# Patient Record
Sex: Male | Born: 1965 | Race: White | Hispanic: No | Marital: Married | Smoking: Never smoker
Health system: Southern US, Community
[De-identification: ages and names within clinical notes are randomized; demographics above are authoritative.]

## PROBLEM LIST (undated history)

## (undated) DIAGNOSIS — N210 Calculus in bladder: Secondary | ICD-10-CM

## (undated) DIAGNOSIS — I10 Essential (primary) hypertension: Secondary | ICD-10-CM

## (undated) DIAGNOSIS — R001 Bradycardia, unspecified: Secondary | ICD-10-CM

## (undated) DIAGNOSIS — R7303 Prediabetes: Secondary | ICD-10-CM

## (undated) DIAGNOSIS — E7800 Pure hypercholesterolemia, unspecified: Secondary | ICD-10-CM

## (undated) HISTORY — PX: COLONOSCOPY: SHX174

---

## 1998-09-05 ENCOUNTER — Ambulatory Visit (HOSPITAL_COMMUNITY): Admission: RE | Admit: 1998-09-05 | Discharge: 1998-09-05 | Payer: Self-pay | Admitting: Orthopedic Surgery

## 1998-09-05 ENCOUNTER — Encounter: Payer: Self-pay | Admitting: Orthopedic Surgery

## 1999-01-13 ENCOUNTER — Emergency Department (HOSPITAL_COMMUNITY): Admission: EM | Admit: 1999-01-13 | Discharge: 1999-01-13 | Payer: Self-pay | Admitting: Emergency Medicine

## 2004-02-09 ENCOUNTER — Emergency Department (HOSPITAL_COMMUNITY): Admission: EM | Admit: 2004-02-09 | Discharge: 2004-02-09 | Payer: Self-pay | Admitting: Emergency Medicine

## 2010-07-28 ENCOUNTER — Emergency Department (HOSPITAL_COMMUNITY): Admission: EM | Admit: 2010-07-28 | Discharge: 2010-07-28 | Payer: Self-pay | Admitting: Emergency Medicine

## 2018-12-05 ENCOUNTER — Ambulatory Visit: Payer: 59 | Admitting: Urology

## 2018-12-05 DIAGNOSIS — R310 Gross hematuria: Secondary | ICD-10-CM | POA: Diagnosis not present

## 2018-12-12 ENCOUNTER — Other Ambulatory Visit (HOSPITAL_COMMUNITY): Payer: Self-pay | Admitting: Urology

## 2018-12-12 ENCOUNTER — Other Ambulatory Visit: Payer: Self-pay | Admitting: Urology

## 2018-12-12 DIAGNOSIS — R310 Gross hematuria: Secondary | ICD-10-CM

## 2019-01-02 ENCOUNTER — Ambulatory Visit (HOSPITAL_COMMUNITY)
Admission: RE | Admit: 2019-01-02 | Discharge: 2019-01-02 | Disposition: A | Discharge status: Home / Self Care | Payer: 59 | Source: Ambulatory Visit | Attending: Urology | Admitting: Urology

## 2019-01-02 ENCOUNTER — Other Ambulatory Visit: Payer: Self-pay

## 2019-01-02 DIAGNOSIS — R310 Gross hematuria: Secondary | ICD-10-CM | POA: Diagnosis not present

## 2019-01-02 LAB — POCT I-STAT CREATININE: Creatinine, Ser: 1.3 mg/dL — ABNORMAL HIGH (ref 0.61–1.24)

## 2019-01-02 MED ORDER — IOHEXOL 300 MG/ML  SOLN
100.0000 mL | Freq: Once | INTRAMUSCULAR | Status: AC | PRN
  Administered 2019-01-02: 100 mL via INTRAVENOUS

## 2019-03-06 ENCOUNTER — Ambulatory Visit (INDEPENDENT_AMBULATORY_CARE_PROVIDER_SITE_OTHER): Payer: 59 | Admitting: Urology

## 2019-03-06 DIAGNOSIS — N210 Calculus in bladder: Secondary | ICD-10-CM | POA: Diagnosis not present

## 2019-03-06 DIAGNOSIS — R3912 Poor urinary stream: Secondary | ICD-10-CM

## 2019-03-06 DIAGNOSIS — N401 Enlarged prostate with lower urinary tract symptoms: Secondary | ICD-10-CM | POA: Diagnosis not present

## 2019-03-06 DIAGNOSIS — R310 Gross hematuria: Secondary | ICD-10-CM

## 2019-03-10 ENCOUNTER — Other Ambulatory Visit: Payer: Self-pay | Admitting: Urology

## 2019-04-09 NOTE — Patient Instructions (Addendum)
YOU NEED TO HAVE A COVID 19 TEST ON_______Monday , July 6th_______, THIS TEST MUST BE DONE BEFORE SURGERY, COME TO [facility] ENTRANCE. ONCE YOUR COVID TEST IS COMPLETED, PLEASE BEGIN THE QUARANTINE INSTRUCTIONS AS OUTLINED IN YOUR HANDOUT.                Nathan Carroll   Your procedure is scheduled on: 04-16-2019   Report to [facility] [place] Main  Entrance    Report to [place] at 5:30 AM      Call this number if you have problems the morning of surgery [phone]    Remember: Do not eat food or drink liquids :After Midnight. BRUSH YOUR TEETH MORNING OF SURGERY AND RINSE YOUR MOUTH OUT, NO CHEWING GUM CANDY OR MINTS.     Take these medicines the morning of surgery with A SIP OF WATER: rosuvastatin  DO NOT TAKE ANY DIABETIC MEDICATIONS DAY OF YOUR SURGERY           How to Manage Your Diabetes Before and After Surgery  Why is it important to control my blood sugar before and after surgery? . Improving blood sugar levels before and after surgery helps healing and can limit problems. . A way of improving blood sugar control is eating a healthy diet by: o  Eating less sugar and carbohydrates o  Increasing activity/exercise o  Talking with your doctor about reaching your blood sugar goals . High blood sugars (greater than 180 mg/dL) can raise your risk of infections and slow your recovery, so you will need to focus on controlling your diabetes during the weeks before surgery. . Make sure that the doctor who takes care of your diabetes knows about your planned surgery including the date and location.    Reviewed and Endorsed by [facility] Patient Education Committee, August 2015                       You may not have any metal on your body including hair pins and              piercings  Do not wear jewelry, make-up, lotions, powders or perfumes, deodorant             Do not wear nail polish.  Do not shave  48 hours prior to surgery.               Men may shave face and neck.   Do not bring valuables to the hospital. [place].  Contacts, dentures or bridgework may not be worn into surgery.  Leave suitcase in the car. After surgery it may be brought to your room.      _____________________________________________________________________             [facility] - Preparing for Surgery Before surgery, you can play an important role.  Because skin is not sterile, your skin needs to be as free of germs as possible.  You can reduce the number of germs on your skin by washing with CHG (chlorahexidine gluconate) soap before surgery.  CHG is an antiseptic cleaner which kills germs and bonds with the skin to continue killing germs even after washing. Please DO NOT use if you have an allergy to CHG or antibacterial soaps.  If your skin becomes reddened/irritated stop using the CHG and inform your nurse when  you arrive at Short Stay. Do not shave (including legs and underarms) for at least 48 hours prior to the first CHG shower.  You may shave your face/neck. Please follow these instructions carefully:  1.  Shower with CHG Soap the night before surgery and the  morning of Surgery.  2.  If you choose to wash your hair, wash your hair first as usual with your  normal  shampoo.  3.  After you shampoo, rinse your hair and body thoroughly to remove the  shampoo.                           4.  Use CHG as you would any other liquid soap.  You can apply chg directly  to the skin and wash                       Gently with a scrungie or clean washcloth.  5.  Apply the CHG Soap to your body ONLY FROM THE NECK DOWN.   Do not use on face/ open                           Wound or open sores. Avoid contact with eyes, ears mouth and genitals (private parts).                       Wash face,  Genitals (private parts) with your normal soap.             6.  Wash thoroughly, paying special attention to the area where  your surgery  will be performed.  7.  Thoroughly rinse your body with warm water from the neck down.  8.  DO NOT shower/wash with your normal soap after using and rinsing off  the CHG Soap.                9.  Pat yourself dry with a clean towel.            10.  Wear clean pajamas.            11.  Place clean sheets on your bed the night of your first shower and do not  sleep with pets. Day of Surgery : Do not apply any lotions/deodorants the morning of surgery.  Please wear clean clothes to the hospital/surgery center.  FAILURE TO FOLLOW THESE INSTRUCTIONS MAY RESULT IN THE CANCELLATION OF YOUR SURGERY PATIENT SIGNATURE_________________________________  NURSE SIGNATURE__________________________________  ________________________________________________________________________

## 2019-04-13 ENCOUNTER — Other Ambulatory Visit: Payer: Self-pay

## 2019-04-13 ENCOUNTER — Other Ambulatory Visit (HOSPITAL_COMMUNITY)
Admission: RE | Admit: 2019-04-13 | Discharge: 2019-04-13 | Disposition: A | Discharge status: Home / Self Care | Payer: 59 | Source: Ambulatory Visit | Attending: Urology | Admitting: Urology

## 2019-04-13 ENCOUNTER — Encounter (HOSPITAL_COMMUNITY): Payer: Self-pay

## 2019-04-13 ENCOUNTER — Encounter (HOSPITAL_COMMUNITY)
Admission: RE | Admit: 2019-04-13 | Discharge: 2019-04-13 | Disposition: A | Discharge status: Home / Self Care | Payer: 59 | Source: Ambulatory Visit | Attending: Urology | Admitting: Urology

## 2019-04-13 DIAGNOSIS — Z833 Family history of diabetes mellitus: Secondary | ICD-10-CM | POA: Diagnosis not present

## 2019-04-13 DIAGNOSIS — R3912 Poor urinary stream: Secondary | ICD-10-CM | POA: Diagnosis not present

## 2019-04-13 DIAGNOSIS — N320 Bladder-neck obstruction: Secondary | ICD-10-CM | POA: Diagnosis not present

## 2019-04-13 DIAGNOSIS — N210 Calculus in bladder: Secondary | ICD-10-CM | POA: Diagnosis present

## 2019-04-13 DIAGNOSIS — Z1159 Encounter for screening for other viral diseases: Secondary | ICD-10-CM | POA: Diagnosis not present

## 2019-04-13 DIAGNOSIS — Z8249 Family history of ischemic heart disease and other diseases of the circulatory system: Secondary | ICD-10-CM | POA: Diagnosis not present

## 2019-04-13 DIAGNOSIS — Z7984 Long term (current) use of oral hypoglycemic drugs: Secondary | ICD-10-CM | POA: Diagnosis not present

## 2019-04-13 DIAGNOSIS — I10 Essential (primary) hypertension: Secondary | ICD-10-CM | POA: Diagnosis not present

## 2019-04-13 DIAGNOSIS — R739 Hyperglycemia, unspecified: Secondary | ICD-10-CM | POA: Diagnosis not present

## 2019-04-13 DIAGNOSIS — Z79899 Other long term (current) drug therapy: Secondary | ICD-10-CM | POA: Diagnosis not present

## 2019-04-13 DIAGNOSIS — N401 Enlarged prostate with lower urinary tract symptoms: Secondary | ICD-10-CM | POA: Diagnosis not present

## 2019-04-13 DIAGNOSIS — E7800 Pure hypercholesterolemia, unspecified: Secondary | ICD-10-CM | POA: Diagnosis not present

## 2019-04-13 HISTORY — DX: Calculus in bladder: N21.0

## 2019-04-13 HISTORY — DX: Essential (primary) hypertension: I10

## 2019-04-13 HISTORY — DX: Bradycardia, unspecified: R00.1

## 2019-04-13 HISTORY — DX: Pure hypercholesterolemia, unspecified: E78.00

## 2019-04-13 HISTORY — DX: Prediabetes: R73.03

## 2019-04-13 LAB — CBC
HCT: 46.3 % (ref 39.0–52.0)
Hemoglobin: 15.6 g/dL (ref 13.0–17.0)
MCH: 26.9 pg (ref 26.0–34.0)
MCHC: 33.7 g/dL (ref 30.0–36.0)
MCV: 80 fL (ref 80.0–100.0)
Platelets: 178 10*3/uL (ref 150–400)
RBC: 5.79 MIL/uL (ref 4.22–5.81)
RDW: 13.6 % (ref 11.5–15.5)
WBC: 8.1 10*3/uL (ref 4.0–10.5)
nRBC: 0 % (ref 0.0–0.2)

## 2019-04-13 LAB — BASIC METABOLIC PANEL
Anion gap: 8 (ref 5–15)
BUN: 30 mg/dL — ABNORMAL HIGH (ref 6–20)
CO2: 22 mmol/L (ref 22–32)
Calcium: 9.3 mg/dL (ref 8.9–10.3)
Chloride: 109 mmol/L (ref 98–111)
Creatinine, Ser: 1.07 mg/dL (ref 0.61–1.24)
GFR calc Af Amer: >60 mL/min (ref >60)
GFR calc non Af Amer: >60 mL/min (ref >60)
Glucose, Bld: 161 mg/dL — ABNORMAL HIGH (ref 70–99)
Potassium: 4.4 mmol/L (ref 3.5–5.1)
Sodium: 139 mmol/L (ref 135–145)

## 2019-04-13 LAB — SARS CORONAVIRUS 2 (TAT 6-24 HRS): SARS Coronavirus 2: NEGATIVE

## 2019-04-13 LAB — HEMOGLOBIN A1C
Hgb A1c MFr Bld: 7.1 % — ABNORMAL HIGH (ref 4.8–5.6)
Mean Plasma Glucose: 157.07 mg/dL

## 2019-04-13 LAB — GLUCOSE, CAPILLARY: Glucose-Capillary: 148 mg/dL — ABNORMAL HIGH (ref 70–99)

## 2019-04-15 NOTE — Anesthesia Preprocedure Evaluation (Addendum)
Anesthesia Evaluation  Patient identified by MRN, date of birth, ID band Patient awake    Reviewed: Allergy & Precautions, NPO status , Patient's Chart, lab work & pertinent test results  History of Anesthesia Complications Negative for: history of anesthetic complications  Airway Mallampati: II  TM Distance: >3 FB Neck ROM: Full    Dental  (+) Dental Advisory Given, Chipped,    Pulmonary neg pulmonary ROS,    breath sounds clear to auscultation       Cardiovascular hypertension, Pt. on medications (-) angina Rhythm:Regular Rate:Normal     Neuro/Psych negative neurological ROS  negative psych ROS   GI/Hepatic negative GI ROS, Neg liver ROS,   Endo/Other   Obesity Pre-DM   Renal/GU negative Renal ROS    BPH     Musculoskeletal negative musculoskeletal ROS (+)   Abdominal   Peds  Hematology negative hematology ROS (+)   Anesthesia Other Findings   Reproductive/Obstetrics                            Anesthesia Physical Anesthesia Plan  ASA: II  Anesthesia Plan: General   Post-op Pain Management:    Induction: Intravenous  PONV Risk Score and Plan: 2 and Treatment may vary due to age or medical condition, Ondansetron, Dexamethasone and Midazolam  Airway Management Planned: LMA  Additional Equipment: None  Intra-op Plan:   Post-operative Plan: Extubation in OR  Informed Consent: I have reviewed the patients History and Physical, chart, labs and discussed the procedure including the risks, benefits and alternatives for the proposed anesthesia with the patient or authorized representative who has indicated his/her understanding and acceptance.     Dental advisory given  Plan Discussed with: CRNA and Anesthesiologist  Anesthesia Plan Comments:        Anesthesia Quick Evaluation

## 2019-04-15 NOTE — H&P (Signed)
CC/HPI: I have blood in my urine.     Nathan Carroll returns today in f/u for cystoscopy to complete his hematuria evaluation. He has not had further bleeding. The CT shows 2 bladder stones about 3 cm in size. He has persistent LUTS with urgency and a reduced stream.   GU Hx: Nathan Carroll is a 53 yo male who is sent in consultation by Dr. Renne CriglerPharr for evaluation of gross hematuria. He had 10-20 RBC's on a UA in early January and his Cr on 10/06/18 was 1.24. He has a prior history of stones and passed a couple early last year. He had the onset of gross hematuria in 6/19. He had several episodes then but it has diminished and he might have some pink urine. Monday he had some burning with urination. He had a pelvic crush injury in 1999 with testicular injury. He has no pain now. His UA today has 3+ blood. His IPSS is 13 with some urgency and frequency. He has no recent UTI's. He has had prior stone episodes but no GU procedures.    ALLERGIES: None   MEDICATIONS: Lisinopril 40 mg tablet  Metformin Hcl 500 mg tablet  Rosuvastatin Calcium 5 mg tablet     GU PSH: No GU PSH    NON-GU PSH: No Non-GU PSH    GU PMH: Gross hematuria, He has painless hematuria but has a history of stones. I will have him return with a CT for possible cystoscopy. - 12/05/2018 Renal calculus    NON-GU PMH: Hypercholesterolemia Hyperglycemia, unspecified Hypertension    FAMILY HISTORY: Diabetes - Runs in Family, Father, Mother Heart Disease - Father Lung Cancer - Father nephrolithiasis - Father   SOCIAL HISTORY: Marital Status: Married Preferred Language: English Current Smoking Status: Patient has never smoked.   Tobacco Use Assessment Completed: Used Tobacco in last 30 days? Has never drank.  Drinks 4+ caffeinated drinks per day. 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    REVIEW OF SYSTEMS:    GU Review Male:   Patient reports frequent urination, hard to postpone urination, burning/ pain with urination, get  up at night to urinate, leakage of urine, and stream starts and stops. Patient denies trouble starting your stream, have to strain to urinate , erection problems, and penile pain.  Gastrointestinal (Upper):   Patient denies nausea, vomiting, and indigestion/ heartburn.  Gastrointestinal (Lower):   Patient reports constipation. Patient denies diarrhea.  Constitutional:   Patient reports fatigue. Patient denies fever, night sweats, and weight loss.  Skin:   Patient denies skin rash/ lesion and itching.  Eyes:   Patient denies blurred vision and double vision.  Ears/ Nose/ Throat:   Patient denies sore throat and sinus problems.  Hematologic/Lymphatic:   Patient denies swollen glands and easy bruising.  Cardiovascular:   Patient denies leg swelling and chest pains.  Respiratory:   Patient denies cough and shortness of breath.  Endocrine:   Patient denies excessive thirst.  Musculoskeletal:   Patient denies back pain and joint pain.  Neurological:   Patient denies headaches and dizziness.  Psychologic:   Patient denies depression and anxiety.   VITAL SIGNS:      03/06/2019 11:02 AM  Weight 200 lb / 90.72 kg  Height 68 in / 172.72 cm  BP 132/81 mmHg  Pulse 62 /min  Temperature 98.0 F / 36.6 C  BMI 30.4 kg/m   MULTI-SYSTEM PHYSICAL EXAMINATION:    Constitutional: Well-nourished. No physical deformities. Normally developed. Good grooming.  Respiratory: Normal breath sounds.  No labored breathing, no use of accessory muscles.   Cardiovascular: Regular rate and rhythm. No murmur, no gallop.      PAST DATA REVIEWED:  Source Of History:  Patient  Records Review:   AUA Symptom Score  Urine Test Review:   Urinalysis  X-Ray Review: C.T. Hematuria: Reviewed Films. Reviewed Report. Discussed With Patient. 2 3cm bladder stones.     PROCEDURES:         Flexible Cystoscopy - 52000  Risks, benefits, and some of the potential complications of the procedure were discussed. 78ml of 2% lidocaine jelly  was instilled intraurethrally.  Cipro 500mg  given for antibiotic prophylaxis.     Meatus:  Normal size. Normal location. Normal condition.  Urethra:  No strictures.  External Sphincter:  Normal.  Verumontanum:  Normal.  Prostate:  Obstructing. Moderate hyperplasia.  Bladder Neck:  Non-obstructing.  Ureteral Orifices:  Normal location. Normal size. Normal shape. Effluxed clear urine.  Bladder:  Mild trabeculation. Erythematous mucosa. 2 3cm stones. No tumors.      The procedure was well tolerated and there were no complications.         Urinalysis - 81003 Dipstick Dipstick Cont'd  Specimen: Voided Bilirubin: Neg  Color: Yellow Ketones: Neg  Appearance: Clear Blood: 3+  Specific Gravity: 1.025 Protein: 2+  pH: 5.0 Urobilinogen: 0.2  Glucose: 3+ Nitrites: Neg    Leukocyte Esterase: Neg    ASSESSMENT:      ICD-10 Details  1 GU:   BPH w/LUTS - N40.1 HE has BPH with BOO with 2 3cm bladder stones. I discussed options and will get him set up for cystolithalopaxy and TURP. His stones are large but seem to be soft. I reviewd the risks of a TURP including bleeding, infection, incontinence, stricture, need for secondary procedures, ejaculatory and erectile dysfunction, thrombotic events, fluid overload and anesthetic complications. I explained that 95% of men will have relief of the obstructive symptoms and about 70% will have relief of the irritative symptoms.   2   Weak Urinary Stream - R39.12   3   Bladder Stone - N21.0   4   Gross hematuria - R31.0    PLAN:           Schedule Return Visit/Planned Activity: Next Available Appointment - Schedule Surgery          Document Letter(s):  Created for Patient: Clinical Summary

## 2019-04-16 ENCOUNTER — Encounter (HOSPITAL_COMMUNITY)
Admission: AD | Disposition: A | Discharge status: Home / Self Care | Payer: Self-pay | Source: Ambulatory Visit | Attending: Urology

## 2019-04-16 ENCOUNTER — Ambulatory Visit (HOSPITAL_COMMUNITY): Payer: 59 | Admitting: Anesthesiology

## 2019-04-16 ENCOUNTER — Encounter (HOSPITAL_COMMUNITY): Payer: Self-pay | Admitting: *Deleted

## 2019-04-16 ENCOUNTER — Other Ambulatory Visit: Payer: Self-pay

## 2019-04-16 ENCOUNTER — Ambulatory Visit (HOSPITAL_COMMUNITY): Payer: 59 | Admitting: Physician Assistant

## 2019-04-16 ENCOUNTER — Observation Stay (HOSPITAL_COMMUNITY)
Admission: AD | Admit: 2019-04-16 | Discharge: 2019-04-17 | Disposition: A | Discharge status: Home / Self Care | Payer: 59 | Source: Ambulatory Visit | Attending: Urology | Admitting: Urology

## 2019-04-16 DIAGNOSIS — Z833 Family history of diabetes mellitus: Secondary | ICD-10-CM | POA: Insufficient documentation

## 2019-04-16 DIAGNOSIS — N210 Calculus in bladder: Principal | ICD-10-CM | POA: Diagnosis present

## 2019-04-16 DIAGNOSIS — I10 Essential (primary) hypertension: Secondary | ICD-10-CM | POA: Insufficient documentation

## 2019-04-16 DIAGNOSIS — R739 Hyperglycemia, unspecified: Secondary | ICD-10-CM | POA: Insufficient documentation

## 2019-04-16 DIAGNOSIS — Z7984 Long term (current) use of oral hypoglycemic drugs: Secondary | ICD-10-CM | POA: Insufficient documentation

## 2019-04-16 DIAGNOSIS — Z1159 Encounter for screening for other viral diseases: Secondary | ICD-10-CM | POA: Insufficient documentation

## 2019-04-16 DIAGNOSIS — Z79899 Other long term (current) drug therapy: Secondary | ICD-10-CM | POA: Insufficient documentation

## 2019-04-16 DIAGNOSIS — N401 Enlarged prostate with lower urinary tract symptoms: Secondary | ICD-10-CM | POA: Insufficient documentation

## 2019-04-16 DIAGNOSIS — N320 Bladder-neck obstruction: Secondary | ICD-10-CM | POA: Insufficient documentation

## 2019-04-16 DIAGNOSIS — E7800 Pure hypercholesterolemia, unspecified: Secondary | ICD-10-CM | POA: Insufficient documentation

## 2019-04-16 DIAGNOSIS — R3912 Poor urinary stream: Secondary | ICD-10-CM | POA: Insufficient documentation

## 2019-04-16 DIAGNOSIS — Z8249 Family history of ischemic heart disease and other diseases of the circulatory system: Secondary | ICD-10-CM | POA: Insufficient documentation

## 2019-04-16 HISTORY — PX: CYSTOSCOPY WITH LITHOLAPAXY: SHX1425

## 2019-04-16 HISTORY — PX: TRANSURETHRAL RESECTION OF PROSTATE: SHX73

## 2019-04-16 LAB — GLUCOSE, CAPILLARY
Glucose-Capillary: 190 mg/dL — ABNORMAL HIGH (ref 70–99)
Glucose-Capillary: 252 mg/dL — ABNORMAL HIGH (ref 70–99)
Glucose-Capillary: 269 mg/dL — ABNORMAL HIGH (ref 70–99)
Glucose-Capillary: 203 mg/dL — ABNORMAL HIGH (ref 70–99)

## 2019-04-16 SURGERY — CYSTOSCOPY, WITH BLADDER CALCULUS LITHOLAPAXY
Anesthesia: General

## 2019-04-16 MED ORDER — MIDAZOLAM HCL 2 MG/2ML IJ SOLN
INTRAMUSCULAR | Status: AC
  Filled 2019-04-16: qty 2

## 2019-04-16 MED ORDER — ACETAMINOPHEN 325 MG PO TABS: 650.0000 mg | ORAL_TABLET | ORAL | Status: DC | PRN

## 2019-04-16 MED ORDER — LISINOPRIL 20 MG PO TABS
40.0000 mg | ORAL_TABLET | Freq: Every day | ORAL | Status: DC
  Administered 2019-04-16 – 2019-04-17 (×2): 40 mg via ORAL
  Filled 2019-04-16 (×2): qty 2

## 2019-04-16 MED ORDER — 0.9 % SODIUM CHLORIDE (POUR BTL) OPTIME
TOPICAL | Status: DC | PRN
  Administered 2019-04-16: 1000 mL

## 2019-04-16 MED ORDER — FENTANYL CITRATE (PF) 100 MCG/2ML IJ SOLN
INTRAMUSCULAR | Status: AC
  Filled 2019-04-16: qty 2

## 2019-04-16 MED ORDER — SODIUM CHLORIDE 0.9 % IR SOLN
Status: DC | PRN
  Administered 2019-04-16: 54000 mL

## 2019-04-16 MED ORDER — LIDOCAINE 2% (20 MG/ML) 5 ML SYRINGE
INTRAMUSCULAR | Status: AC
  Filled 2019-04-16: qty 5

## 2019-04-16 MED ORDER — OXYCODONE HCL 5 MG/5ML PO SOLN: 5.0000 mg | Freq: Once | ORAL | Status: DC | PRN

## 2019-04-16 MED ORDER — FLEET ENEMA 7-19 GM/118ML RE ENEM: 1.0000 | ENEMA | Freq: Once | RECTAL | Status: DC | PRN

## 2019-04-16 MED ORDER — OXYCODONE HCL 5 MG PO TABS
5.0000 mg | ORAL_TABLET | ORAL | Status: DC | PRN
  Administered 2019-04-16: 5 mg via ORAL
  Filled 2019-04-16: qty 1

## 2019-04-16 MED ORDER — ONDANSETRON HCL 4 MG/2ML IJ SOLN
INTRAMUSCULAR | Status: AC
  Filled 2019-04-16: qty 2

## 2019-04-16 MED ORDER — CEFAZOLIN SODIUM-DEXTROSE 2-4 GM/100ML-% IV SOLN
2.0000 g | INTRAVENOUS | Status: AC
  Administered 2019-04-16: 2 g via INTRAVENOUS
  Filled 2019-04-16: qty 100

## 2019-04-16 MED ORDER — METFORMIN HCL ER 500 MG PO TB24
500.0000 mg | ORAL_TABLET | Freq: Two times a day (BID) | ORAL | Status: DC
  Administered 2019-04-16 – 2019-04-17 (×2): 500 mg via ORAL
  Filled 2019-04-16 (×3): qty 1

## 2019-04-16 MED ORDER — ONDANSETRON HCL 4 MG/2ML IJ SOLN
INTRAMUSCULAR | Status: DC | PRN
  Administered 2019-04-16: 4 mg via INTRAVENOUS

## 2019-04-16 MED ORDER — DOCUSATE SODIUM 100 MG PO CAPS
100.0000 mg | ORAL_CAPSULE | Freq: Two times a day (BID) | ORAL | Status: DC
  Administered 2019-04-16 – 2019-04-17 (×2): 100 mg via ORAL
  Filled 2019-04-16 (×2): qty 1

## 2019-04-16 MED ORDER — FENTANYL CITRATE (PF) 100 MCG/2ML IJ SOLN: 25.0000 ug | INTRAMUSCULAR | Status: DC | PRN

## 2019-04-16 MED ORDER — PHENYLEPHRINE 40 MCG/ML (10ML) SYRINGE FOR IV PUSH (FOR BLOOD PRESSURE SUPPORT)
PREFILLED_SYRINGE | INTRAVENOUS | Status: AC
  Filled 2019-04-16: qty 10

## 2019-04-16 MED ORDER — PROMETHAZINE HCL 25 MG/ML IJ SOLN: 6.2500 mg | INTRAMUSCULAR | Status: DC | PRN

## 2019-04-16 MED ORDER — PROPOFOL 10 MG/ML IV BOLUS
INTRAVENOUS | Status: DC | PRN
  Administered 2019-04-16: 180 mg via INTRAVENOUS

## 2019-04-16 MED ORDER — ROSUVASTATIN CALCIUM 5 MG PO TABS
5.0000 mg | ORAL_TABLET | Freq: Every day | ORAL | Status: DC
  Administered 2019-04-17: 5 mg via ORAL
  Filled 2019-04-16: qty 1

## 2019-04-16 MED ORDER — SODIUM CHLORIDE 0.9 % IR SOLN
3000.0000 mL | Status: DC
  Administered 2019-04-16: 3000 mL

## 2019-04-16 MED ORDER — CEPHALEXIN 500 MG PO CAPS
500.0000 mg | ORAL_CAPSULE | Freq: Three times a day (TID) | ORAL | Status: DC
  Administered 2019-04-16 – 2019-04-17 (×4): 500 mg via ORAL
  Filled 2019-04-16 (×4): qty 1

## 2019-04-16 MED ORDER — ONDANSETRON HCL 4 MG/2ML IJ SOLN: 4.0000 mg | INTRAMUSCULAR | Status: DC | PRN

## 2019-04-16 MED ORDER — BISACODYL 10 MG RE SUPP: 10.0000 mg | Freq: Every day | RECTAL | Status: DC | PRN

## 2019-04-16 MED ORDER — POTASSIUM CHLORIDE IN NACL 20-0.45 MEQ/L-% IV SOLN
INTRAVENOUS | Status: DC
  Administered 2019-04-16 (×2): via INTRAVENOUS
  Filled 2019-04-16 (×3): qty 1000

## 2019-04-16 MED ORDER — HYDROMORPHONE HCL 1 MG/ML IJ SOLN: 0.5000 mg | INTRAMUSCULAR | Status: DC | PRN

## 2019-04-16 MED ORDER — DIPHENHYDRAMINE HCL 12.5 MG/5ML PO ELIX: 12.5000 mg | ORAL_SOLUTION | Freq: Four times a day (QID) | ORAL | Status: DC | PRN

## 2019-04-16 MED ORDER — OXYBUTYNIN CHLORIDE 5 MG PO TABS: 5.0000 mg | ORAL_TABLET | Freq: Three times a day (TID) | ORAL | Status: DC | PRN

## 2019-04-16 MED ORDER — ZOLPIDEM TARTRATE 5 MG PO TABS: 5.0000 mg | ORAL_TABLET | Freq: Every evening | ORAL | Status: DC | PRN

## 2019-04-16 MED ORDER — LACTATED RINGERS IV SOLN
INTRAVENOUS | Status: DC
  Administered 2019-04-16: 06:00:00 via INTRAVENOUS

## 2019-04-16 MED ORDER — FENTANYL CITRATE (PF) 100 MCG/2ML IJ SOLN
INTRAMUSCULAR | Status: DC | PRN
  Administered 2019-04-16 (×4): 25 ug via INTRAVENOUS
  Administered 2019-04-16 (×2): 50 ug via INTRAVENOUS
  Administered 2019-04-16 (×4): 25 ug via INTRAVENOUS

## 2019-04-16 MED ORDER — PHENYLEPHRINE 40 MCG/ML (10ML) SYRINGE FOR IV PUSH (FOR BLOOD PRESSURE SUPPORT)
PREFILLED_SYRINGE | INTRAVENOUS | Status: DC | PRN
  Administered 2019-04-16 (×4): 80 ug via INTRAVENOUS

## 2019-04-16 MED ORDER — EPHEDRINE 5 MG/ML INJ
INTRAVENOUS | Status: AC
  Filled 2019-04-16: qty 10

## 2019-04-16 MED ORDER — DEXAMETHASONE SODIUM PHOSPHATE 10 MG/ML IJ SOLN
INTRAMUSCULAR | Status: DC | PRN
  Administered 2019-04-16: 8 mg via INTRAVENOUS

## 2019-04-16 MED ORDER — DIPHENHYDRAMINE HCL 50 MG/ML IJ SOLN: 12.5000 mg | Freq: Four times a day (QID) | INTRAMUSCULAR | Status: DC | PRN

## 2019-04-16 MED ORDER — EPHEDRINE SULFATE-NACL 50-0.9 MG/10ML-% IV SOSY
PREFILLED_SYRINGE | INTRAVENOUS | Status: DC | PRN
  Administered 2019-04-16: 10 mg via INTRAVENOUS
  Administered 2019-04-16: 5 mg via INTRAVENOUS

## 2019-04-16 MED ORDER — MIDAZOLAM HCL 5 MG/5ML IJ SOLN
INTRAMUSCULAR | Status: DC | PRN
  Administered 2019-04-16: 2 mg via INTRAVENOUS

## 2019-04-16 MED ORDER — DEXAMETHASONE SODIUM PHOSPHATE 10 MG/ML IJ SOLN
INTRAMUSCULAR | Status: AC
  Filled 2019-04-16: qty 1

## 2019-04-16 MED ORDER — LIDOCAINE 2% (20 MG/ML) 5 ML SYRINGE
INTRAMUSCULAR | Status: DC | PRN
  Administered 2019-04-16: 60 mg via INTRAVENOUS

## 2019-04-16 MED ORDER — OXYCODONE HCL 5 MG PO TABS: 5.0000 mg | ORAL_TABLET | Freq: Once | ORAL | Status: DC | PRN

## 2019-04-16 MED ORDER — INSULIN ASPART 100 UNIT/ML ~~LOC~~ SOLN
0.0000 [IU] | Freq: Three times a day (TID) | SUBCUTANEOUS | Status: DC
  Administered 2019-04-16: 8 [IU] via SUBCUTANEOUS
  Administered 2019-04-17 (×2): 3 [IU] via SUBCUTANEOUS

## 2019-04-16 MED ORDER — SENNOSIDES-DOCUSATE SODIUM 8.6-50 MG PO TABS: 1.0000 | ORAL_TABLET | Freq: Every evening | ORAL | Status: DC | PRN

## 2019-04-16 MED ORDER — PROPOFOL 10 MG/ML IV BOLUS
INTRAVENOUS | Status: AC
  Filled 2019-04-16: qty 20

## 2019-04-16 SURGICAL SUPPLY — 21 items
BAG URINE DRAINAGE (UROLOGICAL SUPPLIES) ×1 IMPLANT
BAG URO CATCHER STRL LF (MISCELLANEOUS) ×2 IMPLANT
CATH FOLEY 3WAY 30CC 22FR (CATHETERS) ×1 IMPLANT
CATH URET 5FR 28IN OPEN ENDED (CATHETERS) IMPLANT
CLOTH BEACON ORANGE TIMEOUT ST (SAFETY) ×2 IMPLANT
COVER WAND RF STERILE (DRAPES) IMPLANT
ELECT REM PT RETURN 15FT ADLT (MISCELLANEOUS) ×2 IMPLANT
FIBER LASER FLEXIVA 1000 (UROLOGICAL SUPPLIES) ×3 IMPLANT
FIBER LASER FLEXIVA 550 (UROLOGICAL SUPPLIES) IMPLANT
GLOVE SURG SS PI 8.0 STRL IVOR (GLOVE) IMPLANT
GOWN STRL REUS W/TWL XL LVL3 (GOWN DISPOSABLE) ×2 IMPLANT
HOLDER FOLEY CATH W/STRAP (MISCELLANEOUS) IMPLANT
KIT PROBE TRILOGY 3.9X350 (MISCELLANEOUS) ×1 IMPLANT
KIT TURNOVER KIT A (KITS) ×1 IMPLANT
LOOP CUT BIPOLAR 24F LRG (ELECTROSURGICAL) ×1 IMPLANT
MANIFOLD NEPTUNE II (INSTRUMENTS) ×2 IMPLANT
PACK CYSTO (CUSTOM PROCEDURE TRAY) ×2 IMPLANT
SYR 30ML LL (SYRINGE) ×1 IMPLANT
SYRINGE IRR TOOMEY STRL 70CC (SYRINGE) ×1 IMPLANT
TUBING CONNECTING 10 (TUBING) ×2 IMPLANT
TUBING UROLOGY SET (TUBING) ×2 IMPLANT

## 2019-04-16 NOTE — Anesthesia Postprocedure Evaluation (Signed)
Anesthesia Post Note  Patient: Nathan Carroll  Procedure(s) Performed: CYSTOSCOPY WITH LITHOLAPAXY (N/A ) TRANSURETHRAL INCIOSION OF THE PROSTATE (TURP) (N/A )     Patient location during evaluation: PACU Anesthesia Type: General Level of consciousness: awake and alert Pain management: pain level controlled Vital Signs Assessment: post-procedure vital signs reviewed and stable Respiratory status: spontaneous breathing, nonlabored ventilation, respiratory function stable and patient connected to nasal cannula oxygen Cardiovascular status: blood pressure returned to baseline and stable Postop Assessment: no apparent nausea or vomiting Anesthetic complications: no    Last Vitals:  Vitals:   04/16/19 1641 04/16/19 2102  BP: 110/76 104/65  Pulse: 66 65  Resp: 16 20  Temp:  36.9 C  SpO2: 100% 98%    Last Pain:  Vitals:   04/16/19 2102  TempSrc: Oral  PainSc:                  Audry Pili

## 2019-04-16 NOTE — Anesthesia Procedure Notes (Signed)
Procedure Name: LMA Insertion Date/Time: 04/16/2019 7:26 AM Performed by: Victoriano Lain, CRNA Pre-anesthesia Checklist: Patient identified, Emergency Drugs available, Suction available, Patient being monitored and Timeout performed Patient Re-evaluated:Patient Re-evaluated prior to induction Oxygen Delivery Method: Circle system utilized Preoxygenation: Pre-oxygenation with 100% oxygen Induction Type: IV induction LMA: LMA with gastric port inserted LMA Size: 4.0 Number of attempts: 1 Placement Confirmation: positive ETCO2 and breath sounds checked- equal and bilateral Tube secured with: Tape Dental Injury: Teeth and Oropharynx as per pre-operative assessment

## 2019-04-16 NOTE — Transfer of Care (Signed)
Immediate Anesthesia Transfer of Care Note  Patient: Nathan Carroll  Procedure(s) Performed: CYSTOSCOPY WITH LITHOLAPAXY (N/A ) TRANSURETHRAL RESECTION OF THE PROSTATE (TURP) (N/A )  Patient Location: PACU  Anesthesia Type:General  Level of Consciousness: awake, alert , oriented and patient cooperative  Airway & Oxygen Therapy: Patient Spontanous Breathing and Patient connected to face mask oxygen  Post-op Assessment: Report given to RN, Post -op Vital signs reviewed and stable and Patient moving all extremities  Post vital signs: Reviewed and stable  Last Vitals:  Vitals Value Taken Time  BP 150/101 04/16/19 1109  Temp    Pulse 91 04/16/19 1111  Resp 15 04/16/19 1111  SpO2 100 % 04/16/19 1111  Vitals shown include unvalidated device data.  Last Pain:  Vitals:   04/16/19 0546  TempSrc: Oral      Patients Stated Pain Goal: 3 (27/51/70 0174)  Complications: No apparent anesthesia complications

## 2019-04-16 NOTE — Progress Notes (Addendum)
Patient doing well with minimal discomfort.  Urine light pink on slow CBI.   .BP 110/76 (BP Location: Left Arm)   Pulse 66   Temp 98.5 F (36.9 C) (Oral)   Resp 16   Ht 5\' 8"  (1.727 m)   Wt 94 kg   SpO2 100%   BMI 31.51 kg/m .  Plan for home in AM after foley out and voiding.   No scripts for discharge.

## 2019-04-16 NOTE — Interval H&P Note (Signed)
History and Physical Interval Note:  04/16/2019 7:14 AM  Nathan Carroll  has presented today for surgery, with the diagnosis of [place].  The various methods of treatment have been discussed with the patient and family. After consideration of risks, benefits and other options for treatment, the patient has consented to  Procedure(s): CYSTOSCOPY WITH LITHOLAPAXY (N/A) TRANSURETHRAL RESECTION OF THE PROSTATE (TURP) (N/A) as a surgical intervention.  The patient's history has been reviewed, patient examined, no change in status, stable for surgery.  I have reviewed the patient's chart and labs.  Questions were answered to the patient's satisfaction.     Irine Seal

## 2019-04-16 NOTE — Discharge Instructions (Signed)
Transurethral Resection of the Prostate, Care After °This sheet gives you information about how to care for yourself after your procedure. Your health care provider may also give you more specific instructions. If you have problems or questions, contact your health care provider. °What can I expect after the procedure? °After the procedure, it is common to have: °· Mild pain in your lower abdomen. °· Soreness or mild discomfort in your penis from having the catheter inserted during the procedure. °· A feeling of urgency when you need to urinate. °· A small amount of blood in your urine. You may notice some small blood clots in your urine. These are normal. °Follow these instructions at home: °Medicines °· Take over-the-counter and prescription medicines only as told by your health care provider. °· If you were prescribed an antibiotic medicine, take it as told by your health care provider. Do not stop taking the antibiotic even if you start to feel better. °· Ask your health care provider if the medicine prescribed to you: °? Requires you to avoid driving or using heavy machinery. °? Can cause constipation. You may need to take actions to prevent or treat constipation, such as: °§ Take over-the-counter or prescription medicines. °§ Eat foods that are high in fiber, such as fresh fruits and vegetables, whole grains, and beans. °§ Limit foods that are high in fat and processed sugars, such as fried or sweet foods. °· Do not drive for 24 hours if you were given a sedative during your procedure. °Activity ° °· Return to your normal activities as told by your health care provider. Ask your health care provider what activities are safe for you. °· Do not lift anything that is heavier than 10 lb (4.5 kg), or the limit that you are told, for 3 weeks after the procedure or until your health care provider says that it is safe. °· Avoid intense physical activity for as long as told by your health care provider. °· Avoid  sitting for a long time without moving. Get up and move around one or more times every few hours. This helps to prevent blood clots. You may increase your physical activity gradually as you start to feel better. °Lifestyle °· Do not drink alcohol for as long as told by your health care provider. This is especially important if you are taking prescription pain medicines. °· Do not engage in sexual activity until your health care provider says that you can do this. °General instructions ° °· Do not take baths, swim, or use a hot tub until your health care provider approves. °· Drink enough fluid to keep your urine pale yellow. °· Urinate as soon as you feel the need to. Do not try to hold your urine for long periods of time. °· If your health care provider approves, you may take a stool softener for 2-3 weeks to prevent you from straining to have a bowel movement. °· Wear compression stockings as told by your health care provider. These stockings help to prevent blood clots and reduce swelling in your legs. °· Keep all follow-up visits as told by your health care provider. This is important. °Contact a health care provider if you have: °· Difficulty urinating. °· A fever. °· Pain that gets worse or does not improve with medicine. °· Blood in your urine that does not go away after 1 week of resting and drinking more fluids. °· Swelling in your penis or testicles. °Get help right away if: °· You are unable   to urinate. °· You are having more blood clots in your urine instead of fewer. °· You have: °? Large blood clots. °? A lot of blood in your urine. °? Pain in your back or lower abdomen. °? Pain or swelling in your legs. °? Chills and you are shaking. °? Difficulty breathing or shortness of breath. °Summary °· After the procedure, it is common to have a small amount of blood in your urine. °· Avoid heavy lifting and intense physical activity for as long as told by your health care provider. °· Urinate as soon as you  feel the need to. Do not try to hold your urine for long periods of time. °· Keep all follow-up visits as told by your health care provider. This is important. °This information is not intended to replace advice given to you by your health care provider. Make sure you discuss any questions you have with your health care provider. °Document Released: 09/24/2005 Document Revised: 01/14/2019 Document Reviewed: 06/25/2018 °[employer] Patient Education © 2020 [employer] ° °

## 2019-04-16 NOTE — Op Note (Signed)
Procedure: 1.  Cystoscopy with cystolitholapaxy of greater than 2.5 cm stone. 2.  Transurethral incision of the prostate.  Preoperative diagnosis: Two 3 cm bladder stones and BPH with bladder outlet obstruction.  Postop diagnosis: Same.  Surgeon: Dr. Irine Seal.  Anesthesia: General.  Specimen: Stone fragments.  Drains: 22 French three-way Foley catheter.  EBL: Minimal.  Complications: None.  Indications: The patient is a 53 year old male who presented for evaluation of hematuria and voiding symptoms and was found to have 2  3 cm bladder stones and BPH with bladder outlet obstruction.  After reviewing the options he is elected endoscopic management with cystolitholapaxy and TURP.  Procedure: He was given 2 g of Ancef.  A general anesthetic was induced.  He was placed in the lithotomy position and fitted with PAS hose.  His perineum and genitalia were prepped with Betadine solution and he was draped in usual sterile fashion.  Cystoscopy was performed with the 23 [place] scope and 30 degree lens.  Examination revealed a normal urethra.  The external sphincter was intact.  The prostatic urethra was 2 to 3 cm in length without significant lateral lobe enlargement but with a high stiff bladder neck.  I was able to get into the bladder with some difficulty over the bladder neck.  He has had a prior pelvic crush injury which may have contributed to some of the fixation at the bladder neck.  Inspection of the bladder revealed the 2 large stones that had been on CT scan and office cystoscopy.  There was considerable bladder wall erythema that appeared most consistent with inflammation related to the stones.  Ureteral orifices were unremarkable.  Because of the size of the stones it was felt that the use of percutaneous nephrolithotomy instruments would be most appropriate.  The 71 French nephroscope was passed under direct vision.  Initially I was unable to get it through the sphincter.  But after  dilation to 30 [place] with [employer] sounds, I was able to negotiate the scope into the bladder.  The trilogy lithotrite was prepared and inserted and the stone was engaged.  The stones were found to be very hard and resisted fragmentation with the trilogy using either the lithotrite or the ultrasonic component.  I was only able to drill shallow holes in the stone even with maximum power settings.  I then used the 1000 m holmium laser fiber set on 2 J and 20 Hz.  With this setting I was able to effectively fragment the stones; however it was a tedious process that required 3 fibers and approximately 2-1/2 hours of laser therapy to sufficiently fragment the stones for complete removal.  Intermittently during the fragmentation the lithotrite was passed to clear the channel and also to aspirate small fragments from the bladder, a chore for which it was effective.  The final fragments were removed using 2 prong graspers and aspiration and final inspection of the bladder after completion of the lithotripsy demonstrated some residual sand adherent to the mucosa and very small fragments but no significant residual fragments.  The bladder wall remained intact although the mucosa was irritated from the prolonged therapy.  The nephroscope was then removed and the 26 French continuous-flow resectoscope sheath was placed with the aid of the visual obturator.  Initially this was fitted with an Beatrix Fetters handle with a bipolar loop and a 30 degree lens.  Saline had been used as the irrigant throughout.  I took to swipes through the bladder neck with the loop  and then realized that the patient would be best served with transurethral incision of the prostate due to the lack of bulky hyperplasia and the presence of a high bladder neck.  The needle electrode replaced the loop and the bladder neck was incised at 5:00 from just distal to the ureteral orifice to lateral to the [place] down into the fat at the bladder neck and  proximal prostate.  This allowed the prostatic urethra to open widely.  A second incision was made on the right at 7:00.  The loop was then placed back in the scope and hemostasis was achieved.  There was no tissue to retrieve.  Once hemostasis was achieved and final inspection revealed no residual material of concern.  The scope was removed and a 22 [place] three-way Foley catheter was inserted.  The balloon was filled with 30 cc of sterile fluid.  The catheter irrigated with clear return and was placed to continuous irrigation and straight drainage.  He was taken down from lithotomy position, his anesthetic was reversed and he was moved to recovery in stable condition.  There were no complications.

## 2019-04-17 ENCOUNTER — Encounter (HOSPITAL_COMMUNITY): Payer: Self-pay | Admitting: Urology

## 2019-04-17 DIAGNOSIS — N210 Calculus in bladder: Secondary | ICD-10-CM | POA: Diagnosis not present

## 2019-04-17 LAB — GLUCOSE, CAPILLARY
Glucose-Capillary: 171 mg/dL — ABNORMAL HIGH (ref 70–99)
Glucose-Capillary: 154 mg/dL — ABNORMAL HIGH (ref 70–99)

## 2019-04-17 LAB — HIV ANTIBODY (ROUTINE TESTING W REFLEX): HIV Screen 4th Generation wRfx: NONREACTIVE

## 2019-04-17 NOTE — Discharge Summary (Signed)
Physician Discharge Summary  Patient ID: Nathan Carroll MRN: [id number] DOB/AGE: 1966-05-28 53 y.o.  Admit date: 04/16/2019 Discharge date: 04/17/2019  Admission Diagnoses:  Discharge Diagnoses:  Active Problems:   Bladder stone   Discharged Condition: good  Hospital Course: 53 year old malestatus post cystolitholapaxy and transurethral incision of the prostate.  Continuous bladder irrigation was weaned overnight.  He passed a voiding trial and was stable for discharge.  Consults: None  Significant Diagnostic Studies: none  Treatments: surgery: as above  Discharge Exam: Blood pressure 126/81, pulse (!) 59, temperature 97.6 F (36.4 C), temperature source Oral, resp. rate 16, height 5\' 8"  (1.727 m), weight 94 kg, SpO2 100 %. General appearance: alertno acute distress Adequate perfusion of extremities Nonlabored respiration Abdomen soft, nontender, nondistended  Disposition: Discharge disposition: 01-Home or Self Care        Allergies as of 04/17/2019   No Known Allergies     Medication List    TAKE these medications   lisinopril 40 MG tablet Commonly known as: ZESTRIL Take 40 mg by mouth daily.   metFORMIN 500 MG 24 hr tablet Commonly known as: GLUCOPHAGE-XR Take 500 mg by mouth 2 (two) times a day.   rosuvastatin 5 MG tablet Commonly known as: CRESTOR Take 5 mg by mouth daily.      Follow-up Information    [facility] Follow up.   Why: f/u as scheduled.  Contact information: [address] [place] [phone]          Signed: Marton Redwood, III 04/17/2019, 10:16 AM

## 2019-04-17 NOTE — Progress Notes (Signed)
Pt urine in the string of bottles has been bloody but showing increasing clarity with no clots present by the third bottle. Will continue to collect urine and monitor for abnormalities.

## 2019-04-17 NOTE — Progress Notes (Signed)
AVS given to patient and explained at the bedside. Medications and follow up appointments have been explained with pt verbalizing understanding.  

## 2019-05-01 ENCOUNTER — Ambulatory Visit: Payer: No Typology Code available for payment source | Admitting: Urology

## 2019-05-04 LAB — CALCULI, WITH PHOTOGRAPH (CLINICAL LAB)
Ammonium Acid Urate Calculi: 5 %
Uric Acid Calculi: 95 %
Weight Calculi: 15487 mg

## 2019-07-31 ENCOUNTER — Other Ambulatory Visit: Payer: Self-pay

## 2019-07-31 ENCOUNTER — Ambulatory Visit (INDEPENDENT_AMBULATORY_CARE_PROVIDER_SITE_OTHER): Payer: 59 | Admitting: Urology

## 2019-07-31 DIAGNOSIS — R82992 Hyperoxaluria: Secondary | ICD-10-CM

## 2019-07-31 DIAGNOSIS — N401 Enlarged prostate with lower urinary tract symptoms: Secondary | ICD-10-CM

## 2019-07-31 DIAGNOSIS — R34 Anuria and oliguria: Secondary | ICD-10-CM | POA: Diagnosis not present

## 2019-07-31 DIAGNOSIS — R350 Frequency of micturition: Secondary | ICD-10-CM

## 2019-09-29 ENCOUNTER — Encounter: Payer: Self-pay | Admitting: Urology

## 2019-10-12 ENCOUNTER — Telehealth: Payer: Self-pay | Admitting: Urology

## 2019-10-12 NOTE — Telephone Encounter (Signed)
Pt's wife called and stated the pt was supposed to received a "urine kit" and he never received it. Would like to know if we can get him one.

## 2019-10-12 NOTE — Telephone Encounter (Signed)
Spoke with pt. Will mail pt litholink form for pt to complete 24 hr urine.

## 2019-12-04 ENCOUNTER — Ambulatory Visit: Payer: 59 | Admitting: Urology

## 2019-12-25 LAB — PSA: PSA: 1.6

## 2020-01-01 ENCOUNTER — Other Ambulatory Visit: Payer: Self-pay

## 2020-01-01 ENCOUNTER — Ambulatory Visit (INDEPENDENT_AMBULATORY_CARE_PROVIDER_SITE_OTHER): Payer: 59 | Admitting: Urology

## 2020-01-01 VITALS — BP 144/83 | HR 84 | Temp 97.7°F

## 2020-01-01 DIAGNOSIS — R350 Frequency of micturition: Secondary | ICD-10-CM | POA: Diagnosis not present

## 2020-01-01 DIAGNOSIS — N210 Calculus in bladder: Secondary | ICD-10-CM | POA: Diagnosis not present

## 2020-01-01 DIAGNOSIS — N401 Enlarged prostate with lower urinary tract symptoms: Secondary | ICD-10-CM

## 2020-01-01 DIAGNOSIS — N138 Other obstructive and reflux uropathy: Secondary | ICD-10-CM

## 2020-01-01 DIAGNOSIS — Z87442 Personal history of urinary calculi: Secondary | ICD-10-CM

## 2020-01-01 DIAGNOSIS — R82992 Hyperoxaluria: Secondary | ICD-10-CM

## 2020-01-01 LAB — POCT URINALYSIS DIPSTICK
Bilirubin, UA: NEGATIVE
Blood, UA: NEGATIVE
Glucose, UA: NEGATIVE
Ketones, UA: NEGATIVE
Leukocytes, UA: NEGATIVE
Nitrite, UA: NEGATIVE
Protein, UA: NEGATIVE
Spec Grav, UA: 1.02 (ref 1.010–1.025)
Urobilinogen, UA: 0.2 E.U./dL
pH, UA: 5 (ref 5.0–8.0)

## 2020-01-01 MED ORDER — POTASSIUM CITRATE ER 10 MEQ (1080 MG) PO TBCR: 10.0000 meq | EXTENDED_RELEASE_TABLET | Freq: Three times a day (TID) | ORAL | 3 refills | Status: DC

## 2020-01-01 NOTE — Progress Notes (Signed)
Subjective:  1. Bladder stone   2. Hyperoxaluria   3. BPH with urinary obstruction   4. Urinary frequency   5. History of kidney stones      01/01/20: S/p TURP and cystolithalopaxy in 7/20. Stream is strong. He has no further hematuria. He has no incontinence. His IPSS is 1.   He has not been sexually active but he can get an erection and has variable ejaculatory function with occasional delayed drainage.   His stone was Uric acid and his metabolic w/u showed normal labs but he had a low volume, slightly high oxalate, low pH and high Uric acid supersaturation.   IPSS    Row Name 01/01/20 1500         International Prostate Symptom Score   How often have you had the sensation of not emptying your bladder?  Not at All     How often have you had to urinate less than every two hours?  Not at All     How often have you found you stopped and started again several times when you urinated?  Not at All     How often have you found it difficult to postpone urination?  Not at All     How often have you had a weak urinary stream?  Not at All     How often have you had to strain to start urination?  Not at All     How many times did you typically get up at night to urinate?  1 Time     Total IPSS Score  1       Quality of Life due to urinary symptoms   If you were to spend the rest of your life with your urinary condition just the way it is now how would you feel about that?  Delighted           ROS:  ROS:  A complete review of systems was performed.  All systems are negative except for pertinent findings as noted.   Review of Systems  All other systems reviewed and are negative.   No Known Allergies  Outpatient Encounter Medications as of 01/01/2020  Medication Sig  . lisinopril (ZESTRIL) 40 MG tablet Take 40 mg by mouth daily.  . metFORMIN (GLUCOPHAGE-XR) 500 MG 24 hr tablet Take 500 mg by mouth 2 (two) times a day.  . potassium citrate (UROCIT-K) 10 MEQ (1080 MG) SR tablet  Take 1 tablet (10 mEq total) by mouth 3 (three) times daily with meals.  . rosuvastatin (CRESTOR) 5 MG tablet Take 5 mg by mouth daily.   No facility-administered encounter medications on file as of 01/01/2020.    Past Medical History:  Diagnosis Date  . Bladder stone   . Hypercholesteremia   . Hypertension   . Prediabetes   . Sinus bradycardia     Past Surgical History:  Procedure Laterality Date  . COLONOSCOPY     x2  . CYSTOSCOPY WITH LITHOLAPAXY N/A 04/16/2019   Procedure: CYSTOSCOPY WITH LITHOLAPAXY;  Surgeon: Irine Seal, MD;  Location: [facility];  Service: Urology;  Laterality: N/A;  . TRANSURETHRAL RESECTION OF PROSTATE N/A 04/16/2019   Procedure: TRANSURETHRAL INCIOSION OF THE PROSTATE (TURP);  Surgeon: Irine Seal, MD;  Location: [facility];  Service: Urology;  Laterality: N/A;    Social History   Socioeconomic History  . Marital status: Married    Spouse name: Not on file  . Number of children: Not on file  . Years  of education: Not on file  . Highest education level: Not on file  Occupational History  . Not on file  Tobacco Use  . Smoking status: Never Smoker  . Smokeless tobacco: Never Used  Substance and Sexual Activity  . Alcohol use: Not Currently  . Drug use: Not on file  . Sexual activity: Not on file  Other Topics Concern  . Not on file  Social History Narrative  . Not on file   Social Determinants of Health   Financial Resource Strain:   . Difficulty of Paying Living Expenses:   Food Insecurity:   . Worried About Programme researcher, broadcasting/film/video in the Last Year:   . Barista in the Last Year:   Transportation Needs:   . Freight forwarder (Medical):   Marland Kitchen Lack of Transportation (Non-Medical):   Physical Activity:   . Days of Exercise per Week:   . Minutes of Exercise per Session:   Stress:   . Feeling of Stress :   Social Connections:   . Frequency of Communication with Friends and Family:   . Frequency of Social Gatherings with Friends and Family:    . Attends Religious Services:   . Active Member of Clubs or Organizations:   . Attends Banker Meetings:   Marland Kitchen Marital Status:   Intimate Partner Violence:   . Fear of Current or Ex-Partner:   . Emotionally Abused:   Marland Kitchen Physically Abused:   . Sexually Abused:     No family history on file.     Objective: Vitals:   01/01/20 1457  BP: (!) 144/83  Pulse: 84  Temp: 97.7 F (36.5 C)     Physical Exam Vitals reviewed.  Constitutional:      Appearance: Normal appearance.  Neurological:     Mental Status: He is alert.     Lab Results:  Results for orders placed or performed in visit on 01/01/20 (from the past 24 hour(s))  POCT urinalysis dipstick     Status: None   Collection Time: 01/01/20  3:13 PM  Result Value Ref Range   Color, UA yellow    Clarity, UA clear    Glucose, UA Negative Negative   Bilirubin, UA neg    Ketones, UA neg    Spec Grav, UA 1.020 1.010 - 1.025   Blood, UA neg    pH, UA 5.0 5.0 - 8.0   Protein, UA Negative Negative   Urobilinogen, UA 0.2 0.2 or 1.0 E.U./dL   Nitrite, UA neg    Leukocytes, UA Negative Negative   Appearance     Odor      BMET No results for input(s): NA, K, CL, CO2, GLUCOSE, BUN, CREATININE, CALCIUM in the last 72 hours. PSA No results found for: PSA No results found for: TESTOSTERONE    Studies/Results: No results found.  I will get labs from Dr. Renne Crigler.   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  History of Uric acid urolithiasis.   I have refilled the potassium citrate and will have him return in a year with labs.   Labs from Dr. Renne Crigler requested.    Meds ordered this encounter  Medications  . potassium citrate (UROCIT-K) 10 MEQ (1080 MG) SR tablet    Sig: Take 1 tablet (10 mEq total) by mouth 3 (three) times daily with meals.    Dispense:  270 tablet    Refill:  3     Orders Placed  This Encounter  Procedures  . US RENAL    Standing Status:   Future    Standing  Expiration Date:   07/03/2021    Order Specific Question:   Reason for Exam (SYMPTOM  OR DIAGNOSIS REQUIRED)    Answer:   uric acid urolithiasis.    Order Specific Question:   Preferred imaging location?    Answer:   [facility]  . Basic metabolic panel    Standing Status:   Future    Standing Expiration Date:   07/03/2021  . POCT urinalysis dipstick      Return in about 1 year (around 12/31/2020) for F/U with a renal US. .   CC: [facility]      Bjorn Pippin 01/01/2020

## 2020-01-04 ENCOUNTER — Other Ambulatory Visit: Payer: Self-pay

## 2020-01-11 NOTE — Progress Notes (Signed)
Lab results mailed. Per Dr. Annabell Howells to notify pt of normal PSA

## 2020-06-20 ENCOUNTER — Other Ambulatory Visit: Payer: 59

## 2020-06-20 DIAGNOSIS — Z20822 Contact with and (suspected) exposure to covid-19: Secondary | ICD-10-CM

## 2020-06-22 ENCOUNTER — Telehealth: Payer: Self-pay

## 2020-06-22 LAB — SARS-COV-2, NAA 2 DAY TAT

## 2020-06-22 LAB — NOVEL CORONAVIRUS, NAA: SARS-CoV-2, NAA: NOT DETECTED

## 2020-06-22 NOTE — Telephone Encounter (Signed)
Negative COVID results given. Patient results "NOT Detected." Caller expressed understanding. ° °

## 2020-11-04 IMAGING — CT CT ABDOMEN AND PELVIS WITHOUT AND WITH CONTRAST
3 of 12 series · 12 of 46 positions shown, 18 images · IV contrast (omnipaque)
Comparison: None

CLINICAL DATA: Hematuria for 4 months.

EXAM:
CT ABDOMEN AND PELVIS WITHOUT AND WITH CONTRAST
TECHNIQUE: Multidetector CT imaging of the abdomen and pelvis was performed
following the standard protocol before and following the bolus
administration of intravenous contrast.
CONTRAST:  100mL OMNIPAQUE IOHEXOL 300 MG/ML  SOLN

[Series 2: axial post · axial · 0.79mm/px · z∈[+998,+1128]mm · 3 of 92 slices shown]
[im 14/92  soft-tissue]
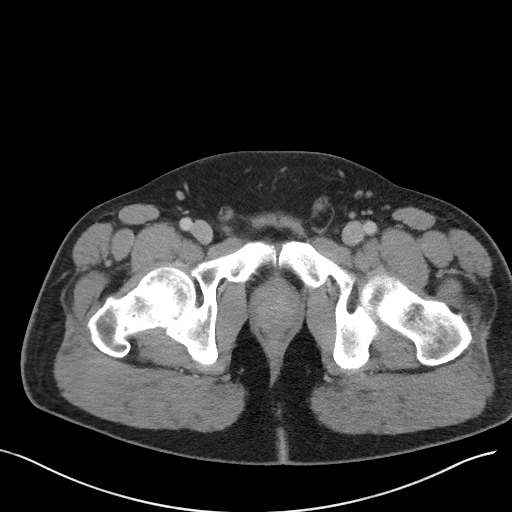
[im 27/92  soft-tissue]
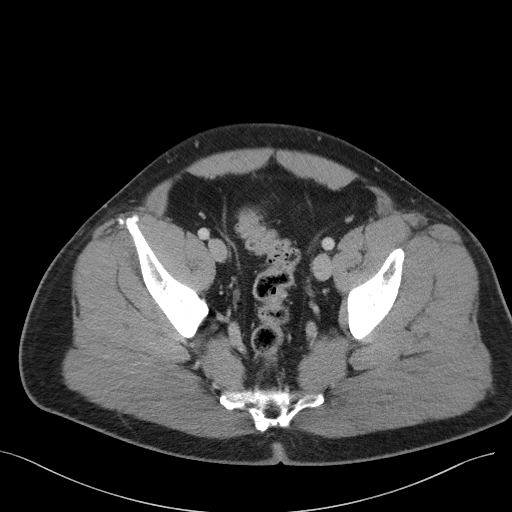
[im 40/92  soft-tissue]
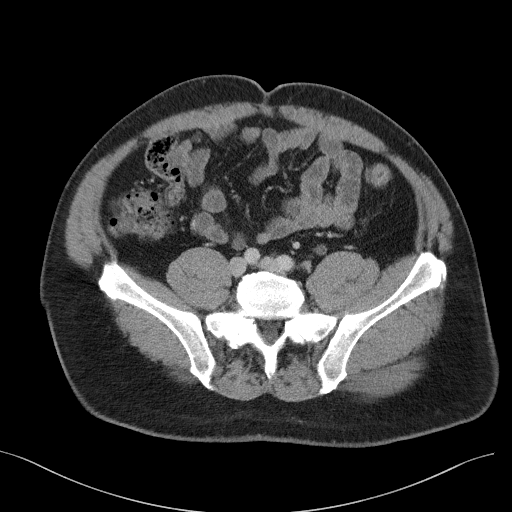

[Series 5: coronal post · coronal · 0.89mm/px · 2 of 101 slices shown, 3 images]
[im 34/101  soft-tissue]
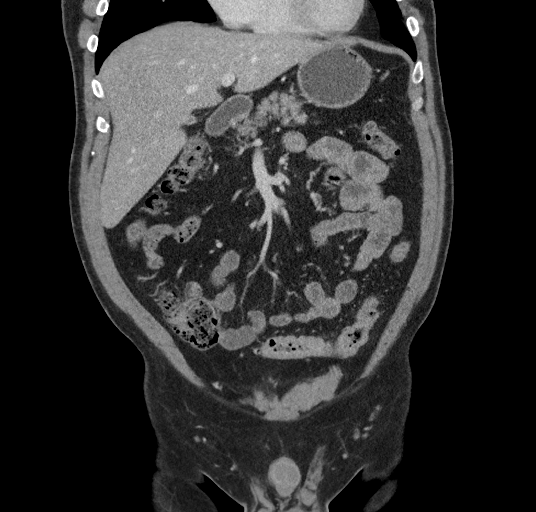
[im 34/101  bone]
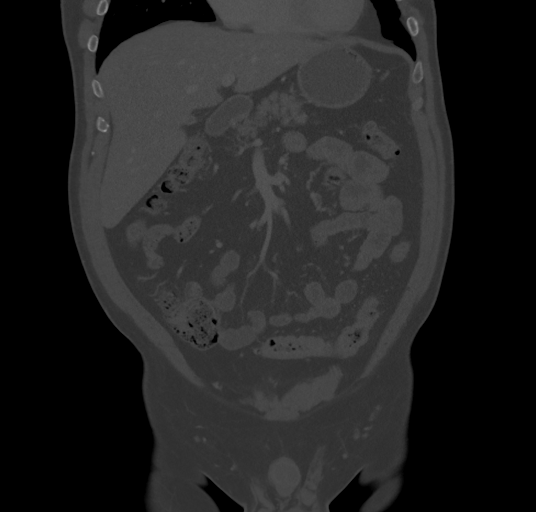
[im 67/101  soft-tissue]
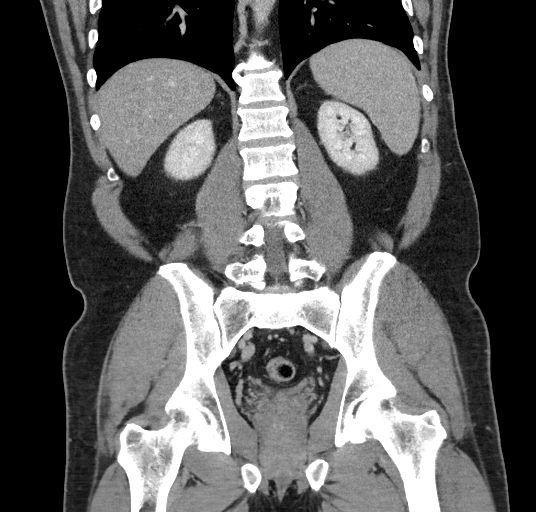

[Series 13: axial delay · axial · delayed · 0.82mm/px · z∈[+971,+1341]mm · 7 of 100 slices shown, 12 images]
[im 13/100  soft-tissue]
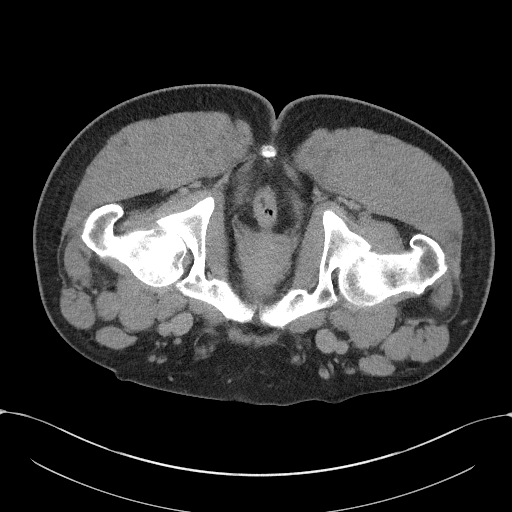
[im 13/100  bone]
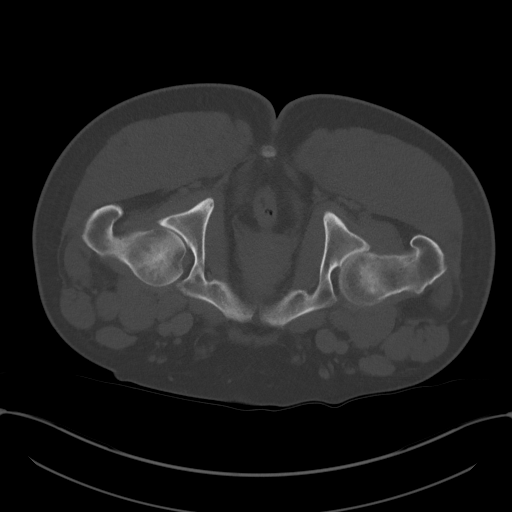
[im 25/100  soft-tissue]
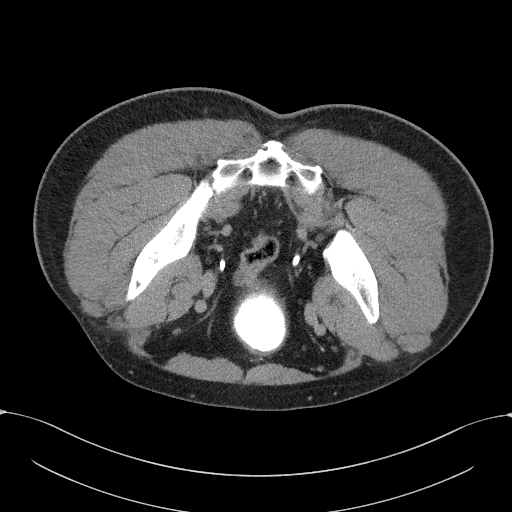
[im 38/100  soft-tissue]
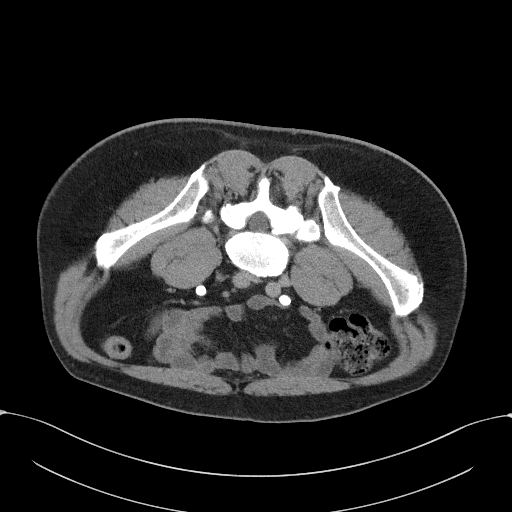
[im 50/100  soft-tissue]
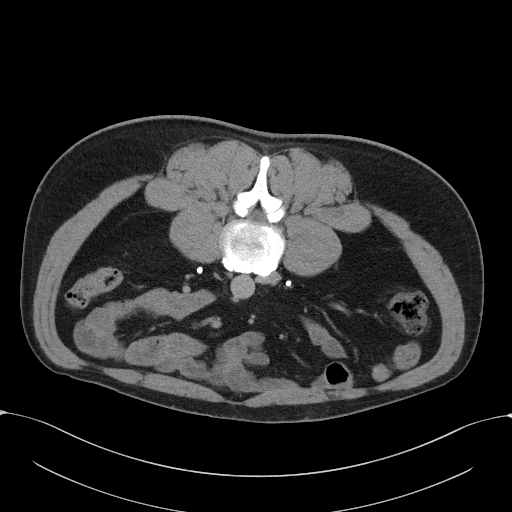
[im 50/100  lung]
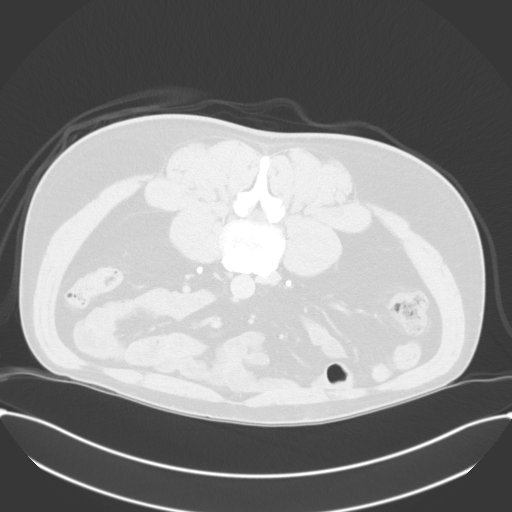
[im 62/100  soft-tissue]
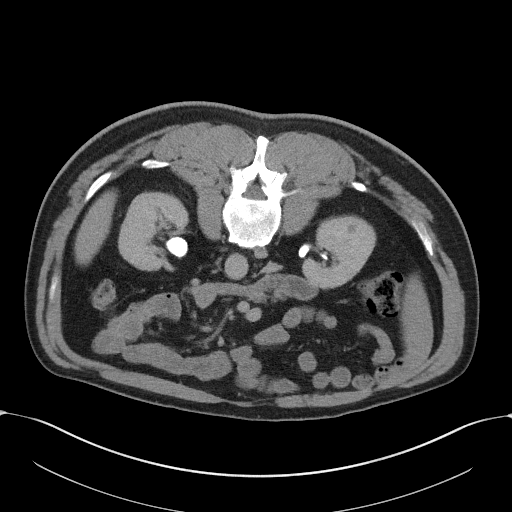
[im 62/100  lung]
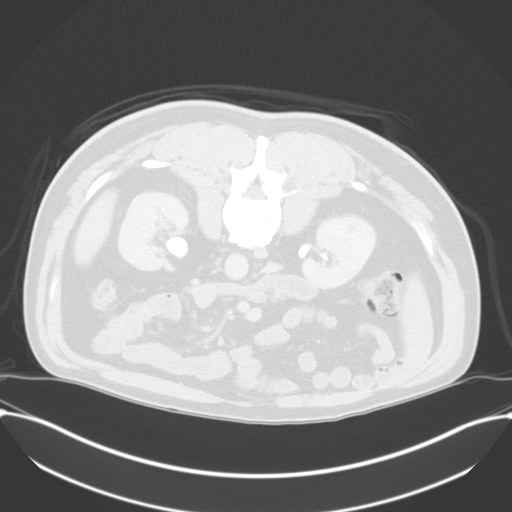
[im 75/100  soft-tissue]
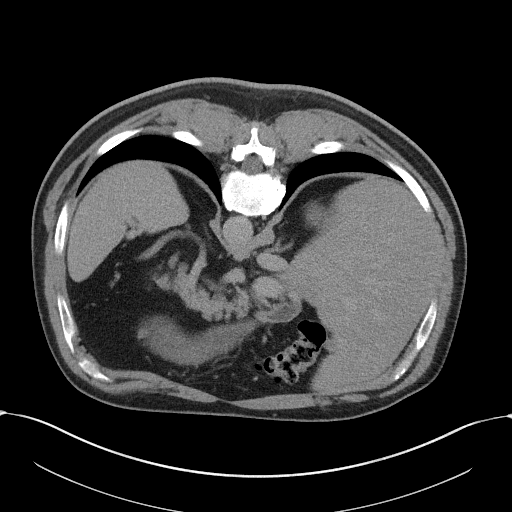
[im 75/100  lung]
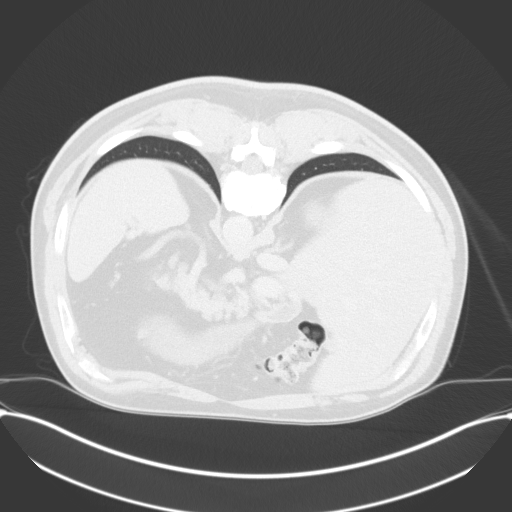
[im 87/100  soft-tissue]
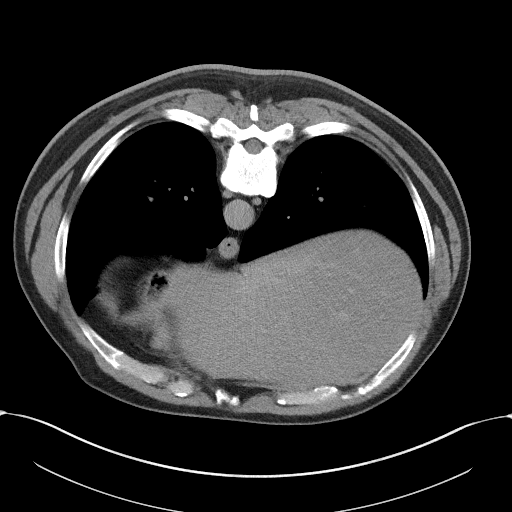
[im 87/100  lung]
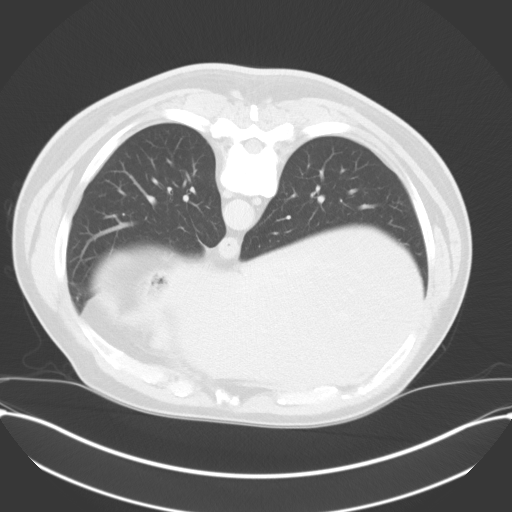

[12 of 46 positions shown; findings below may reference images not displayed]

FINDINGS: Lower chest: Unremarkable

Hepatobiliary: Contracted gallbladder.  Otherwise unremarkable.

Pancreas: Unremarkable

Spleen: Unremarkable

Adrenals/Urinary Tract: Multiple small hypodense lesions of both
kidneys are technically too small to characterize although
statistically likely to be benign cysts. There are 2 large
oval-shaped smooth calculi in the urinary bladder, one measuring
by 1.7 by 2.9 cm and the other measuring 3.2 by 1.7 by 3.4 cm. These
are mobile and associated with mild generalized urinary bladder wall
thickening probably attributable to low-grade inflammation. No
additional filling defect is identified along the urothelium to
favor urothelial malignancy. No collecting system or ureteral
calculi observed. The adrenal glands are normal.

Stomach/Bowel: Sigmoid colon diverticulosis.  Normal appendix.

Vascular/Lymphatic: Unremarkable

Reproductive: Unremarkable

Other: No supplemental non-categorized findings.

Musculoskeletal: Mild bridging spurring of the right sacroiliac
joint. Mild bilateral foraminal impingement at L5-S1 due to facet
spurring. Small left groin hernia contains adipose tissue.
IMPRESSION: 1. Two large, smooth, oval-shaped bladder stones are present. There
is generalized wall thickening in the urinary bladder likely due to
secondary low-grade inflammation.
2. Sigmoid colon diverticulosis.
3. Mild bilateral foraminal impingement at L5-S1 due to facet
spurring.

## 2020-12-22 ENCOUNTER — Other Ambulatory Visit: Payer: Self-pay

## 2020-12-22 ENCOUNTER — Other Ambulatory Visit: Payer: 59

## 2020-12-22 DIAGNOSIS — Z87442 Personal history of urinary calculi: Secondary | ICD-10-CM

## 2020-12-23 LAB — BASIC METABOLIC PANEL
BUN/Creatinine Ratio: 17 (ref 9–20)
BUN: 20 mg/dL (ref 6–24)
CO2: 22 mmol/L (ref 20–29)
Calcium: 9.1 mg/dL (ref 8.7–10.2)
Chloride: 102 mmol/L (ref 96–106)
Creatinine, Ser: 1.18 mg/dL (ref 0.76–1.27)
Glucose: 104 mg/dL — ABNORMAL HIGH (ref 65–99)
Potassium: 4.3 mmol/L (ref 3.5–5.2)
Sodium: 141 mmol/L (ref 134–144)
eGFR: 73 mL/min/{1.73_m2} (ref >59)

## 2020-12-26 NOTE — Progress Notes (Deleted)
Subjective:  No diagnosis found.   01/01/20: S/p TURP and cystolithalopaxy in 7/20. Stream is strong. He has no further hematuria. He has no incontinence. His IPSS is 1.   He has not been sexually active but he can get an erection and has variable ejaculatory function with occasional delayed drainage.   His stone was Uric acid and his metabolic w/u showed normal labs but he had a low volume, slightly high oxalate, low pH and high Uric acid supersaturation.        ROS:  ROS:  A complete review of systems was performed.  All systems are negative except for pertinent findings as noted.   Review of Systems  All other systems reviewed and are negative.   No Known Allergies  Outpatient Encounter Medications as of 12/29/2020  Medication Sig  . lisinopril (ZESTRIL) 40 MG tablet Take 40 mg by mouth daily.  . metFORMIN (GLUCOPHAGE-XR) 500 MG 24 hr tablet Take 500 mg by mouth 2 (two) times a day.  . potassium citrate (UROCIT-K) 10 MEQ (1080 MG) SR tablet Take 1 tablet (10 mEq total) by mouth 3 (three) times daily with meals.  . rosuvastatin (CRESTOR) 5 MG tablet Take 5 mg by mouth daily.   No facility-administered encounter medications on file as of 12/29/2020.    Past Medical History:  Diagnosis Date  . Bladder stone   . Hypercholesteremia   . Hypertension   . Prediabetes   . Sinus bradycardia     Past Surgical History:  Procedure Laterality Date  . COLONOSCOPY     x2  . CYSTOSCOPY WITH LITHOLAPAXY N/A 04/16/2019   Procedure: CYSTOSCOPY WITH LITHOLAPAXY;  Surgeon: Bjorn Pippin, MD;  Location: [facility];  Service: Urology;  Laterality: N/A;  . TRANSURETHRAL RESECTION OF PROSTATE N/A 04/16/2019   Procedure: TRANSURETHRAL INCIOSION OF THE PROSTATE (TURP);  Surgeon: Bjorn Pippin, MD;  Location: [facility];  Service: Urology;  Laterality: N/A;    Social History   Socioeconomic History  . Marital status: Married    Spouse name: Not on file  . Number of children: Not on file  . Years of  education: Not on file  . Highest education level: Not on file  Occupational History  . Not on file  Tobacco Use  . Smoking status: Never Smoker  . Smokeless tobacco: Never Used  Substance and Sexual Activity  . Alcohol use: Not Currently  . Drug use: Not on file  . Sexual activity: Not on file  Other Topics Concern  . Not on file  Social History Narrative  . Not on file   Social Determinants of Health   Financial Resource Strain: Not on file  Food Insecurity: Not on file  Transportation Needs: Not on file  Physical Activity: Not on file  Stress: Not on file  Social Connections: Not on file  Intimate Partner Violence: Not on file    No family history on file.     Objective: There were no vitals filed for this visit.   Physical Exam Vitals reviewed.  Constitutional:      Appearance: Normal appearance.  Neurological:     Mental Status: He is alert.     Lab Results:  No results found for this or any previous visit (from the past 24 hour(s)).  BMET No results for input(s): NA, K, CL, CO2, GLUCOSE, BUN, CREATININE, CALCIUM in the last 72 hours. PSA PSA  Date Value Ref Range Status  12/25/2019 1.6  Final   No results found for:  TESTOSTERONE    Studies/Results: No results found.  I will get labs from Dr. Renne Crigler.   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  History of Uric acid urolithiasis.   I have refilled the potassium citrate and will have him return in a year with labs.   Labs from Dr. Renne Crigler requested.    No orders of the defined types were placed in this encounter.    No orders of the defined types were placed in this encounter.     No follow-ups on file.   CC: [facility]      Bjorn Pippin 12/26/2020

## 2020-12-29 ENCOUNTER — Ambulatory Visit: Payer: 59 | Admitting: Urology

## 2021-01-03 ENCOUNTER — Ambulatory Visit (HOSPITAL_COMMUNITY)
Admission: RE | Admit: 2021-01-03 | Discharge: 2021-01-03 | Disposition: A | Discharge status: Home / Self Care | Payer: 59 | Source: Ambulatory Visit | Attending: Urology | Admitting: Urology

## 2021-01-03 ENCOUNTER — Other Ambulatory Visit: Payer: Self-pay

## 2021-01-03 DIAGNOSIS — Z87442 Personal history of urinary calculi: Secondary | ICD-10-CM | POA: Insufficient documentation

## 2021-01-05 ENCOUNTER — Ambulatory Visit: Payer: 59 | Admitting: Urology

## 2021-01-06 ENCOUNTER — Telehealth: Payer: Self-pay

## 2021-01-06 ENCOUNTER — Ambulatory Visit: Payer: 59 | Admitting: Urology

## 2021-01-06 NOTE — Telephone Encounter (Signed)
-----   Message from Bjorn Pippin, MD sent at 01/06/2021  9:19 AM EDT ----- No stones seen.   The only finding is a fatty liver.   Please forward report to his PCP.

## 2021-01-06 NOTE — Telephone Encounter (Signed)
Pt called and notified

## 2021-02-09 ENCOUNTER — Ambulatory Visit (INDEPENDENT_AMBULATORY_CARE_PROVIDER_SITE_OTHER): Payer: Managed Care, Other (non HMO) | Admitting: Urology

## 2021-02-09 ENCOUNTER — Other Ambulatory Visit: Payer: Self-pay

## 2021-02-09 VITALS — BP 124/77 | HR 62 | Temp 98.2°F | Ht 68.0 in | Wt 190.0 lb

## 2021-02-09 DIAGNOSIS — N400 Enlarged prostate without lower urinary tract symptoms: Secondary | ICD-10-CM

## 2021-02-09 DIAGNOSIS — R81 Glycosuria: Secondary | ICD-10-CM

## 2021-02-09 DIAGNOSIS — R82992 Hyperoxaluria: Secondary | ICD-10-CM | POA: Diagnosis not present

## 2021-02-09 DIAGNOSIS — Z87442 Personal history of urinary calculi: Secondary | ICD-10-CM | POA: Diagnosis not present

## 2021-02-09 LAB — URINALYSIS, ROUTINE W REFLEX MICROSCOPIC
Bilirubin, UA: NEGATIVE
Ketones, UA: NEGATIVE
Leukocytes,UA: NEGATIVE
Nitrite, UA: NEGATIVE
Protein,UA: NEGATIVE
Specific Gravity, UA: 1.025 (ref 1.005–1.030)
Urobilinogen, Ur: 0.2 mg/dL (ref 0.2–1.0)
pH, UA: 5.5 (ref 5.0–7.5)

## 2021-02-09 LAB — MICROSCOPIC EXAMINATION
Bacteria, UA: NONE SEEN
RBC, Urine: NONE SEEN /hpf (ref 0–2)
WBC, UA: NONE SEEN /hpf (ref 0–5)

## 2021-02-09 MED ORDER — POTASSIUM CITRATE ER 10 MEQ (1080 MG) PO TBCR: 10.0000 meq | EXTENDED_RELEASE_TABLET | Freq: Three times a day (TID) | ORAL | 3 refills | Status: DC

## 2021-02-09 NOTE — Progress Notes (Signed)

## 2021-02-09 NOTE — Progress Notes (Signed)
Subjective:  1. History of nephrolithiasis   2. Hyperoxaluria   3. Benign prostatic hyperplasia without lower urinary tract symptoms   4. Glycosuria      02/09/21: Nathan Carroll returns today for annual f/u.   A renal US prior to this visit shows no renal or bladder stones. His IPSS is 0.  He had been on potassium citrate but he ran out of the med last week.   His K was 4.3 on 12/22/20.  His medical doc was concerned about the combination with lisinopril and suggested he cut the potassium citrate to 1 daily.   UA has 3+ glucose  01/01/20: S/p TURP and cystolithalopaxy in 7/20. Stream is strong. He has no further hematuria. He has no incontinence. His IPSS is 1.   He has not been sexually active but he can get an erection and has variable ejaculatory function with occasional delayed drainage.   His stone was Uric acid and his metabolic w/u showed normal labs but he had a low volume, slightly high oxalate, low pH and high Uric acid supersaturation.        ROS:  ROS:  A complete review of systems was performed.  All systems are negative except for pertinent findings as noted.   Review of Systems  All other systems reviewed and are negative.   No Known Allergies  Outpatient Encounter Medications as of 02/09/2021  Medication Sig  . lisinopril (ZESTRIL) 40 MG tablet Take 40 mg by mouth daily.  . rosuvastatin (CRESTOR) 20 MG tablet Take 20 mg by mouth at bedtime.  . [DISCONTINUED] potassium citrate (UROCIT-K) 10 MEQ (1080 MG) SR tablet Take 1 tablet (10 mEq total) by mouth 3 (three) times daily with meals.  . metFORMIN (GLUCOPHAGE-XR) 500 MG 24 hr tablet Take 500 mg by mouth 2 (two) times a day. (Patient not taking: Reported on 02/09/2021)  . potassium citrate (UROCIT-K) 10 MEQ (1080 MG) SR tablet Take 1 tablet (10 mEq total) by mouth 3 (three) times daily with meals.  . [DISCONTINUED] rosuvastatin (CRESTOR) 5 MG tablet Take 5 mg by mouth daily.   No facility-administered encounter medications on  file as of 02/09/2021.    Past Medical History:  Diagnosis Date  . Bladder stone   . Hypercholesteremia   . Hypertension   . Prediabetes   . Sinus bradycardia     Past Surgical History:  Procedure Laterality Date  . COLONOSCOPY     x2  . CYSTOSCOPY WITH LITHOLAPAXY N/A 04/16/2019   Procedure: CYSTOSCOPY WITH LITHOLAPAXY;  Surgeon: Irine Seal, MD;  Location: [facility];  Service: Urology;  Laterality: N/A;  . TRANSURETHRAL RESECTION OF PROSTATE N/A 04/16/2019   Procedure: TRANSURETHRAL INCIOSION OF THE PROSTATE (TURP);  Surgeon: Irine Seal, MD;  Location: [facility];  Service: Urology;  Laterality: N/A;    Social History   Socioeconomic History  . Marital status: Married    Spouse name: Not on file  . Number of children: Not on file  . Years of education: Not on file  . Highest education level: Not on file  Occupational History  . Not on file  Tobacco Use  . Smoking status: Never Smoker  . Smokeless tobacco: Never Used  Substance and Sexual Activity  . Alcohol use: Not Currently  . Drug use: Not on file  . Sexual activity: Not on file  Other Topics Concern  . Not on file  Social History Narrative  . Not on file   Social Determinants of Health   Financial  Resource Strain: Not on file  Food Insecurity: Not on file  Transportation Needs: Not on file  Physical Activity: Not on file  Stress: Not on file  Social Connections: Not on file  Intimate Partner Violence: Not on file    No family history on file.     Objective: Vitals:   02/09/21 1403  BP: 124/77  Pulse: 62  Temp: 98.2 F (36.8 C)     Physical Exam Vitals reviewed.  Constitutional:      Appearance: Normal appearance.  Neurological:     Mental Status: He is alert.     Lab Results:  Results for orders placed or performed in visit on 02/09/21 (from the past 24 hour(s))  Urinalysis, Routine w reflex microscopic     Status: Abnormal   Collection Time: 02/09/21  2:18 PM  Result Value Ref Range    Specific Gravity, UA 1.025 1.005 - 1.030   pH, UA 5.5 5.0 - 7.5   Color, UA Yellow Yellow   Appearance Ur Clear Clear   Leukocytes,UA Negative Negative   Protein,UA Negative Negative/Trace   Glucose, UA 3+ (A) Negative   Ketones, UA Negative Negative   RBC, UA Trace (A) Negative   Bilirubin, UA Negative Negative   Urobilinogen, Ur 0.2 0.2 - 1.0 mg/dL   Nitrite, UA Negative Negative   Microscopic Examination See below:    Narrative   Performed at:  [facility] [address] [place], [place]  [id number] Lab Director: Mina Marble [place], Phone:  [phone]  Microscopic Examination     Status: None   Collection Time: 02/09/21  2:18 PM   Urine  Result Value Ref Range   WBC, UA None seen 0 - 5 /hpf   RBC None seen 0 - 2 /hpf   Epithelial Cells (non renal) 0-10 0 - 10 /hpf   Bacteria, UA None seen None seen/Few   Narrative   Performed at:  [facility] [address] [place], [place]  [id number] Lab Director: [place], Phone:  [phone]    BMET No results for input(s): NA, K, CL, CO2, GLUCOSE, BUN, CREATININE, CALCIUM in the last 72 hours. PSA PSA  Date Value Ref Range Status  12/25/2019 1.6  Final   No results found for: TESTOSTERONE Recent Results (from the past 2160 hour(s))  Basic metabolic panel     Status: Abnormal   Collection Time: 12/22/20  4:21 PM  Result Value Ref Range   Glucose 104 (H) 65 - 99 mg/dL   BUN 20 6 - 24 mg/dL   Creatinine, Ser 1.18 0.76 - 1.27 mg/dL   eGFR 73 >59 mL/min/1.73   BUN/Creatinine Ratio 17 9 - 20   Sodium 141 134 - 144 mmol/L   Potassium 4.3 3.5 - 5.2 mmol/L   Chloride 102 96 - 106 mmol/L   CO2 22 20 - 29 mmol/L   Calcium 9.1 8.7 - 10.2 mg/dL  Urinalysis, Routine w reflex microscopic     Status: Abnormal   Collection Time: 02/09/21  2:18 PM  Result Value Ref Range   Specific Gravity, UA 1.025 1.005 - 1.030   pH, UA 5.5 5.0 - 7.5   Color, UA Yellow Yellow   Appearance Ur Clear Clear    Leukocytes,UA Negative Negative   Protein,UA Negative Negative/Trace   Glucose, UA 3+ (A) Negative   Ketones, UA Negative Negative   RBC, UA Trace (A) Negative   Bilirubin, UA Negative Negative   Urobilinogen, Ur  0.2 0.2 - 1.0 mg/dL   Nitrite, UA Negative Negative   Microscopic Examination See below:   Microscopic Examination     Status: None   Collection Time: 02/09/21  2:18 PM   Urine  Result Value Ref Range   WBC, UA None seen 0 - 5 /hpf   RBC None seen 0 - 2 /hpf   Epithelial Cells (non renal) 0-10 0 - 10 /hpf   Bacteria, UA None seen None seen/Few   UA has 3+ glucose but is o/w unremarkable.   Studies/Results: Renal US reviewed.  I will get labs from Dr. Shelia Media.   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  History of Uric acid urolithiasis.   I have refilled the potassium citrate and will have him return in a year.   Labs from 12/22/20 reviewed and his potassium is normal even though he is on lisinopril so he can stay on the current dose of potassium citrate.  He will return in a year with a renal US.  Glycosuria.   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   Meds ordered this encounter  Medications  . potassium citrate (UROCIT-K) 10 MEQ (1080 MG) SR tablet    Sig: Take 1 tablet (10 mEq total) by mouth 3 (three) times daily with meals.    Dispense:  270 tablet    Refill:  3     Orders Placed This Encounter  Procedures  . Microscopic Examination  . US RENAL    Standing Status:   Future    Standing Expiration Date:   02/09/2022    Order Specific Question:   Reason for Exam (SYMPTOM  OR DIAGNOSIS REQUIRED)    Answer:   history of uric acid stones    Order Specific Question:   Preferred imaging location?    Answer:   [facility]  . Urinalysis, Routine w reflex microscopic      Return in about 1 year (around 02/09/2022) for with renal US.   CC: [facility] 02/09/2021 Patient ID: Nathan Carroll, male   DOB: 1966-01-16, 55 y.o.   MRN: [id number]

## 2022-02-01 ENCOUNTER — Ambulatory Visit (HOSPITAL_COMMUNITY)
Admission: RE | Admit: 2022-02-01 | Discharge: 2022-02-01 | Disposition: A | Discharge status: Home / Self Care | Payer: Self-pay | Source: Ambulatory Visit | Attending: Urology | Admitting: Urology

## 2022-02-01 DIAGNOSIS — Z87442 Personal history of urinary calculi: Secondary | ICD-10-CM | POA: Insufficient documentation

## 2022-02-08 ENCOUNTER — Encounter: Payer: Self-pay | Admitting: Urology

## 2022-02-08 ENCOUNTER — Ambulatory Visit (INDEPENDENT_AMBULATORY_CARE_PROVIDER_SITE_OTHER): Payer: Managed Care, Other (non HMO) | Admitting: Urology

## 2022-02-08 VITALS — BP 139/81 | HR 64

## 2022-02-08 DIAGNOSIS — R81 Glycosuria: Secondary | ICD-10-CM

## 2022-02-08 DIAGNOSIS — N200 Calculus of kidney: Secondary | ICD-10-CM

## 2022-02-08 DIAGNOSIS — R82992 Hyperoxaluria: Secondary | ICD-10-CM

## 2022-02-08 DIAGNOSIS — N281 Cyst of kidney, acquired: Secondary | ICD-10-CM

## 2022-02-08 LAB — URINALYSIS, ROUTINE W REFLEX MICROSCOPIC
Bilirubin, UA: NEGATIVE
Ketones, UA: NEGATIVE
Leukocytes,UA: NEGATIVE
Nitrite, UA: NEGATIVE
Protein,UA: NEGATIVE
RBC, UA: NEGATIVE
Specific Gravity, UA: <1.005 — ABNORMAL LOW (ref 1.005–1.030)
Urobilinogen, Ur: 0.2 mg/dL (ref 0.2–1.0)
pH, UA: 5.5 (ref 5.0–7.5)

## 2022-02-08 MED ORDER — POTASSIUM CITRATE ER 10 MEQ (1080 MG) PO TBCR: 10.0000 meq | EXTENDED_RELEASE_TABLET | Freq: Three times a day (TID) | ORAL | 3 refills | Status: DC

## 2022-02-08 NOTE — Progress Notes (Signed)
?Subjective: ? ?1. Bilateral renal stones   ?2. Renal cyst   ?3. Hyperoxaluria   ? ?02/08/22: Nathan Carroll returns today in f/u for his history of uric acid stones.  He has had no flank pain or hematuria.   A renal US prior to this visit showed a 19mm right renal stone and a 60mm left renal stone.  He also has a right renal cyst.  He has been on potassium citrate and is back up to 2 tabs.  .  His last K was 4.3 on 12/22/20 and 4.2 on 01/15/22.  His PSA was 2.21.  He continues to void well after the TURP in 2020.   He has retained erectile function but reduced ejaculation.  ? ?02/09/21: Nathan Carroll returns today for annual f/u.   A renal US prior to this visit shows no renal or bladder stones. His IPSS is 0.  He had been on potassium citrate but he ran out of the med last week.   His K was 4.3 on 12/22/20.  His medical doc was concerned about the combination with lisinopril and suggested he cut the potassium citrate to 1 daily.   UA has 3+ glucose ? ?01/01/20: S/p TURP and cystolithalopaxy in 7/20. Stream is strong. He has no further hematuria. He has no incontinence. His IPSS is 1.  ? ?He has not been sexually active but he can get an erection and has variable ejaculatory function with occasional delayed drainage.  ? ?His stone was Uric acid and his metabolic w/u showed normal labs but he had a low volume, slightly high oxalate, low pH and high Uric acid supersaturation.  ? ?  ? ? ? ?ROS: ? ?ROS:  ?A complete review of systems was performed.  All systems are negative except for pertinent findings as noted.  ? ?Review of Systems  ?All other systems reviewed and are negative. ? ?No Known Allergies ? ?Outpatient Encounter Medications as of 02/08/2022  ?Medication Sig  ? Dapagliflozin-metFORMIN HCl ER (XIGDUO XR) 02-999 MG TB24 1 tablet  ? lisinopril (ZESTRIL) 40 MG tablet Take 40 mg by mouth daily.  ? potassium chloride (KLOR-CON) 10 MEQ tablet Take 10 mEq by mouth 3 (three) times daily.  ? rosuvastatin (CRESTOR) 20 MG tablet Take 20 mg by  mouth at bedtime.  ? [DISCONTINUED] potassium citrate (UROCIT-K) 10 MEQ (1080 MG) SR tablet Take 1 tablet (10 mEq total) by mouth 3 (three) times daily with meals.  ? [DISCONTINUED] potassium citrate (UROCIT-K) 10 MEQ (1080 MG) SR tablet 1 tablet with meals  ? potassium citrate (UROCIT-K) 10 MEQ (1080 MG) SR tablet Take 1 tablet (10 mEq total) by mouth 3 (three) times daily with meals.  ? [DISCONTINUED] metFORMIN (GLUCOPHAGE-XR) 500 MG 24 hr tablet Take 500 mg by mouth 2 (two) times a day. (Patient not taking: Reported on 02/09/2021)  ? ?No facility-administered encounter medications on file as of 02/08/2022.  ? ? ?Past Medical History:  ?Diagnosis Date  ? Bladder stone   ? Hypercholesteremia   ? Hypertension   ? Prediabetes   ? Sinus bradycardia   ? ? ?Past Surgical History:  ?Procedure Laterality Date  ? COLONOSCOPY    ? x2  ? CYSTOSCOPY WITH LITHOLAPAXY N/A 04/16/2019  ? Procedure: CYSTOSCOPY WITH LITHOLAPAXY;  Surgeon: Irine Seal, MD;  Location: [facility];  Service: Urology;  Laterality: N/A;  ? TRANSURETHRAL RESECTION OF PROSTATE N/A 04/16/2019  ? Procedure: TRANSURETHRAL INCIOSION OF THE PROSTATE (TURP);  Surgeon: Irine Seal, MD;  Location: [facility];  Service: Urology;  Laterality: N/A;  ? ? ?Social History  ? ?Socioeconomic History  ? Marital status: Married  ?  Spouse name: Not on file  ? Number of children: Not on file  ? Years of education: Not on file  ? Highest education level: Not on file  ?Occupational History  ? Not on file  ?Tobacco Use  ? Smoking status: Never  ? Smokeless tobacco: Never  ?Substance and Sexual Activity  ? Alcohol use: Not Currently  ? Drug use: Not on file  ? Sexual activity: Not on file  ?Other Topics Concern  ? Not on file  ?Social History Narrative  ? Not on file  ? ?Social Determinants of Health  ? ?Financial Resource Strain: Not on file  ?Food Insecurity: Not on file  ?Transportation Needs: Not on file  ?Physical Activity: Not on file  ?Stress: Not on file  ?Social Connections: Not on  file  ?Intimate Partner Violence: Not on file  ? ? ?No family history on file. ? ? ? ? ?Objective: ?Vitals:  ? 02/08/22 1527  ?BP: 139/81  ?Pulse: 64  ? ? ? ?Physical Exam ?Vitals reviewed.  ?Constitutional:   ?   Appearance: Normal appearance.  ?Neurological:  ?   Mental Status: He is alert.  ? ? ?Lab Results:  ?Results for orders placed or performed in visit on 02/08/22 (from the past 24 hour(s))  ?Urinalysis, Routine w reflex microscopic     Status: Abnormal  ? Collection Time: 02/08/22  3:45 PM  ?Result Value Ref Range  ? Specific Gravity, UA <1.005 (L) 1.005 - 1.030  ? pH, UA 5.5 5.0 - 7.5  ? Color, UA Yellow Yellow  ? Appearance Ur Clear Clear  ? Leukocytes,UA Negative Negative  ? Protein,UA Negative Negative/Trace  ? Glucose, UA 2+ (A) Negative  ? Ketones, UA Negative Negative  ? RBC, UA Negative Negative  ? Bilirubin, UA Negative Negative  ? Urobilinogen, Ur 0.2 0.2 - 1.0 mg/dL  ? Nitrite, UA Negative Negative  ? Microscopic Examination Comment   ? Narrative  ? Performed at:  [facility] ?[address] [place], [place]  [id number] ?Lab Director: [place], Phone:  [phone]  ? ?  ?BMET ?No results for input(s): NA, K, CL, CO2, GLUCOSE, BUN, CREATININE, CALCIUM in the last 72 hours. ?PSA ?PSA  ?Date Value Ref Range Status  ?12/25/2019 1.6  Final  ? ?No results found for: TESTOSTERONE ?Recent Results (from the past 2160 hour(s))  ?Urinalysis, Routine w reflex microscopic     Status: Abnormal  ? Collection Time: 02/08/22  3:45 PM  ?Result Value Ref Range  ? Specific Gravity, UA <1.005 (L) 1.005 - 1.030  ? pH, UA 5.5 5.0 - 7.5  ? Color, UA Yellow Yellow  ? Appearance Ur Clear Clear  ? Leukocytes,UA Negative Negative  ? Protein,UA Negative Negative/Trace  ? Glucose, UA 2+ (A) Negative  ? Ketones, UA Negative Negative  ? RBC, UA Negative Negative  ? Bilirubin, UA Negative Negative  ? Urobilinogen, Ur 0.2 0.2 - 1.0 mg/dL  ? Nitrite, UA Negative Negative  ? Microscopic Examination Comment    ?  Comment: Microscopic follows if indicated.  ? ? ?UA has 3+ glucose but is o/w unremarkable.  ? ?Studies/Results: ?Renal US reviewed. ? ?I will get labs from Dr. Shelia Media.  ?US RENAL ? ?Result Date: 02/02/2022 ?CLINICAL DATA:  Uric acid stones. EXAM: RENAL / URINARY TRACT ULTRASOUND COMPLETE COMPARISON:  Renal ultrasound January 03, 2021  FINDINGS: Right Kidney: Renal measurements: 11.6 x 6.2 x 5.3 cm = volume: 201.7 mL. Echogenicity within normal limits. There is 3 mm nonobstructing stone in the upper pole right kidney. There is a 1.3 x 0.9 x 1.1 cm simple cyst in the midpole right kidney. No follow-up is necessary. Left Kidney: Renal measurements: 12.1 x 6.4 x 5.1 cm = volume: 205.7 mL. Echogenicity within normal limits. There is a 5 mm nonobstructing stone in the lower pole left kidney. Bladder: Appears normal for degree of bladder distention. Other: None. IMPRESSION: Small nonobstructing stones in bilateral kidneys. No hydronephrosis bilaterally. Electronically Signed   By: Abelardo Diesel M.D.   On: 02/02/2022 13:15   ? 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 ? ?History of Uric acid urolithiasis.   I have refilled the potassium citrate and will have him return in a year.   Labs from 12/22/20 reviewed and his potassium is normal even though he is on lisinopril so he can stay on the current dose of potassium citrate.  He will return in a year with a renal US. ? ?Glycosuria.   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  ? ?Meds ordered this encounter  ?Medications  ? potassium citrate (UROCIT-K) 10 MEQ (1080 MG) SR tablet  ?  Sig: Take 1 tablet (10 mEq total) by mouth 3 (three) times daily with meals.  ?  Dispense:  270 tablet  ?  Refill:  3  ?  ? ?Orders Placed This Encounter  ?Procedures  ? US RENAL  ?  Standing Status:   Future  ?  Standing Expiration Date:   02/09/2023  ?  Order Specific Question:   Reason for Exam (SYMPTOM  OR DIAGNOSIS REQUIRED)  ?  Answer:   renal stones  ?  Order  Specific Question:   Preferred imaging location?  ?  Answer:   [facility]  ? Urinalysis, Routine w reflex microscopic  ?  ? ? ?Return in about 1 year (around 02/09/2023) for with renal US. ? ? ?CC: As

## 2022-05-24 ENCOUNTER — Other Ambulatory Visit (HOSPITAL_COMMUNITY): Payer: Self-pay | Admitting: Internal Medicine

## 2022-05-24 ENCOUNTER — Other Ambulatory Visit: Payer: Self-pay | Admitting: Internal Medicine

## 2022-05-24 DIAGNOSIS — I10 Essential (primary) hypertension: Secondary | ICD-10-CM

## 2022-06-15 ENCOUNTER — Other Ambulatory Visit (HOSPITAL_COMMUNITY): Payer: Self-pay

## 2022-07-20 ENCOUNTER — Ambulatory Visit (HOSPITAL_COMMUNITY)
Admission: RE | Admit: 2022-07-20 | Discharge: 2022-07-20 | Disposition: A | Discharge status: Home / Self Care | Payer: Self-pay | Source: Ambulatory Visit | Attending: Internal Medicine | Admitting: Internal Medicine

## 2022-07-20 DIAGNOSIS — I10 Essential (primary) hypertension: Secondary | ICD-10-CM | POA: Insufficient documentation

## 2023-02-01 ENCOUNTER — Ambulatory Visit (HOSPITAL_COMMUNITY)
Admission: RE | Admit: 2023-02-01 | Discharge: 2023-02-01 | Disposition: A | Discharge status: Home / Self Care | Payer: 59 | Source: Ambulatory Visit | Attending: Urology | Admitting: Urology

## 2023-02-01 DIAGNOSIS — N200 Calculus of kidney: Secondary | ICD-10-CM | POA: Diagnosis present

## 2023-02-13 ENCOUNTER — Telehealth: Payer: Self-pay

## 2023-02-13 NOTE — Telephone Encounter (Signed)
Patient is out of town and will not be able to make his follow up to discuss his recent renal US. He has a new appointment scheduled for June  6th at 10:45. He wanted to know if you could call him with those results.

## 2023-02-14 ENCOUNTER — Ambulatory Visit: Payer: 59 | Admitting: Urology

## 2023-02-14 NOTE — Telephone Encounter (Addendum)
Patient aware of MD response, patient denies any symptoms at this time.  He will follow up as scheduled on 06/06

## 2023-03-14 ENCOUNTER — Encounter: Payer: Self-pay | Admitting: Urology

## 2023-03-14 ENCOUNTER — Ambulatory Visit (INDEPENDENT_AMBULATORY_CARE_PROVIDER_SITE_OTHER): Payer: 59 | Admitting: Urology

## 2023-03-14 VITALS — BP 158/82 | HR 57 | Ht 68.0 in | Wt 190.0 lb

## 2023-03-14 DIAGNOSIS — N200 Calculus of kidney: Secondary | ICD-10-CM

## 2023-03-14 DIAGNOSIS — N401 Enlarged prostate with lower urinary tract symptoms: Secondary | ICD-10-CM

## 2023-03-14 DIAGNOSIS — R81 Glycosuria: Secondary | ICD-10-CM | POA: Diagnosis not present

## 2023-03-14 DIAGNOSIS — Z87442 Personal history of urinary calculi: Secondary | ICD-10-CM

## 2023-03-14 DIAGNOSIS — R82992 Hyperoxaluria: Secondary | ICD-10-CM

## 2023-03-14 LAB — URINALYSIS, ROUTINE W REFLEX MICROSCOPIC
Bilirubin, UA: NEGATIVE
Ketones, UA: NEGATIVE
Leukocytes,UA: NEGATIVE
Nitrite, UA: NEGATIVE
Protein,UA: NEGATIVE
RBC, UA: NEGATIVE
Specific Gravity, UA: <1.005 — ABNORMAL LOW (ref 1.005–1.030)
Urobilinogen, Ur: 0.2 mg/dL (ref 0.2–1.0)
pH, UA: 5.5 (ref 5.0–7.5)

## 2023-03-14 NOTE — Progress Notes (Signed)
Subjective:  1. Bilateral renal stones   2. Hyperoxaluria    03/14/23: Nathan Carroll returns today in f/u for his history of uric acid stones.  A renal US prior to this visit shows a possible 7mm non-obstructing right renal stone. He has had no flank pain or hematuria since his last visit.  He continues to void well with and IPSS of 1 and his UA is clear.  He is off of the potassium citrate but is on potassium chloride.  He was taken off of the lisinopril because of a high potassium.     02/08/22: Nathan Carroll returns today in f/u for his history of uric acid stones.  He has had no flank pain or hematuria.   A renal US prior to this visit showed a 3mm right renal stone and a 5mm left renal stone.  He also has a right renal cyst.  He has been on potassium citrate and is back up to 2 tabs.  .  His last K was 4.3 on 12/22/20 and 4.2 on 01/15/22.  His PSA was 2.21.  He continues to void well after the TURP in 2020.   He has retained erectile function but reduced ejaculation.   02/09/21: Nathan Carroll returns today for annual f/u.   A renal US prior to this visit shows no renal or bladder stones. His IPSS is 0.  He had been on potassium citrate but he ran out of the med last week.   His K was 4.3 on 12/22/20.  His medical doc was concerned about the combination with lisinopril and suggested he cut the potassium citrate to 1 daily.   UA has 3+ glucose  01/01/20: S/p TURP and cystolithalopaxy in 7/20. Stream is strong. He has no further hematuria. He has no incontinence. His IPSS is 1.   He has not been sexually active but he can get an erection and has variable ejaculatory function with occasional delayed drainage.   His stone was Uric acid and his metabolic w/u showed normal labs but he had a low volume, slightly high oxalate, low pH and high Uric acid supersaturation.    IPSS     Row Name 03/14/23 1100         International Prostate Symptom Score   How often have you had the sensation of not emptying your bladder? Not at All      How often have you had to urinate less than every two hours? Not at All     How often have you found you stopped and started again several times when you urinated? Not at All     How often have you found it difficult to postpone urination? Not at All     How often have you had a weak urinary stream? Not at All     How often have you had to strain to start urination? Not at All     How many times did you typically get up at night to urinate? 1 Time     Total IPSS Score 1       Quality of Life due to urinary symptoms   If you were to spend the rest of your life with your urinary condition just the way it is now how would you feel about that? Delighted                 ROS:  ROS:  A complete review of systems was performed.  All systems are negative except for pertinent findings as noted.  Review of Systems  All other systems reviewed and are negative.   No Known Allergies  Outpatient Encounter Medications as of 03/14/2023  Medication Sig   amLODipine (NORVASC) 2.5 MG tablet Take 2.5 mg by mouth daily.   Dapagliflozin-metFORMIN HCl ER (XIGDUO XR) 02-999 MG TB24 1 tablet   potassium chloride (KLOR-CON) 10 MEQ tablet Take 10 mEq by mouth 3 (three) times daily.   rosuvastatin (CRESTOR) 20 MG tablet Take 20 mg by mouth at bedtime.   [DISCONTINUED] lisinopril (ZESTRIL) 40 MG tablet Take 40 mg by mouth daily. (Patient not taking: Reported on 03/14/2023)   [DISCONTINUED] potassium citrate (UROCIT-K) 10 MEQ (1080 MG) SR tablet Take 1 tablet (10 mEq total) by mouth 3 (three) times daily with meals. (Patient not taking: Reported on 03/14/2023)   No facility-administered encounter medications on file as of 03/14/2023.    Past Medical History:  Diagnosis Date   Bladder stone    Hypercholesteremia    Hypertension    Prediabetes    Sinus bradycardia     Past Surgical History:  Procedure Laterality Date   COLONOSCOPY     x2   CYSTOSCOPY WITH LITHOLAPAXY N/A 04/16/2019   Procedure:  CYSTOSCOPY WITH LITHOLAPAXY;  Surgeon: Bjorn Pippin, MD;  Location: [facility];  Service: Urology;  Laterality: N/A;   TRANSURETHRAL RESECTION OF PROSTATE N/A 04/16/2019   Procedure: TRANSURETHRAL INCIOSION OF THE PROSTATE (TURP);  Surgeon: Bjorn Pippin, MD;  Location: [facility];  Service: Urology;  Laterality: N/A;    Social History   Socioeconomic History   Marital status: Married    Spouse name: Not on file   Number of children: Not on file   Years of education: Not on file   Highest education level: Not on file  Occupational History   Not on file  Tobacco Use   Smoking status: Never   Smokeless tobacco: Never  Substance and Sexual Activity   Alcohol use: Not Currently   Drug use: Not on file   Sexual activity: Not on file  Other Topics Concern   Not on file  Social History Narrative   Not on file   Social Determinants of Health   Financial Resource Strain: Not on file  Food Insecurity: Not on file  Transportation Needs: Not on file  Physical Activity: Not on file  Stress: Not on file  Social Connections: Not on file  Intimate Partner Violence: Not on file    History reviewed. No pertinent family history.     Objective: Vitals:   03/14/23 1107 03/14/23 1108  BP: (!) 165/107 (!) 158/82  Pulse: 72 (!) 57     Physical Exam Vitals reviewed.  Constitutional:      Appearance: Normal appearance.  Neurological:     Mental Status: He is alert.     Lab Results:  No results found for this or any previous visit (from the past 24 hour(s)).    BMET No results for input(s): "NA", "K", "CL", "CO2", "GLUCOSE", "BUN", "CREATININE", "CALCIUM" in the last 72 hours. PSA PSA  Date Value Ref Range Status  12/25/2019 1.6  Final   No results found for: "TESTOSTERONE" Recent Results (from the past 2160 hour(s))  Urinalysis, Routine w reflex microscopic     Status: Abnormal   Collection Time: 03/14/23 11:14 AM  Result Value Ref Range   Specific Gravity, UA <1.005 (L) 1.005 -  1.030   pH, UA 5.5 5.0 - 7.5   Color, UA Yellow Yellow   Appearance Ur Clear Clear  Leukocytes,UA Negative Negative   Protein,UA Negative Negative/Trace   Glucose, UA 3+ (A) Negative   Ketones, UA Negative Negative   RBC, UA Negative Negative   Bilirubin, UA Negative Negative   Urobilinogen, Ur 0.2 0.2 - 1.0 mg/dL   Nitrite, UA Negative Negative   Microscopic Examination Comment     Comment: Microscopic not indicated and not performed.     UA has 3+ glucose but is o/w unremarkable.   Studies/Results: Renal US reviewed.  I will get labs from Dr. Renne Crigler.  US RENAL  Result Date: 02/01/2023 CLINICAL DATA:  Renal stones.  Yearly follow-up. EXAM: RENAL / URINARY TRACT ULTRASOUND COMPLETE COMPARISON:  None Available. FINDINGS: Right Kidney: Renal measurements: 11.1 x 5.1 x 5.7 cm = volume: 170 mL. Contains a 7 mm echogenic focus without definite shadowing. No hydronephrosis. Left Kidney: Renal measurements: 11.7 x 5.2 x 5.4 cm = volume: 173 mL. Echogenicity within normal limits. No mass or hydronephrosis visualized. Bladder: Appears normal for degree of bladder distention. Other: None. IMPRESSION: 1. 7 mm echogenic focus in the right kidney without definite shadowing could represent a nonobstructing stone. 2. No other abnormalities. Electronically Signed   By: Gerome Sam III M.D.   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  History of Uric acid urolithiasis.   He is off of the potassium citrate.  I have recommended he consider Litholyte bid.    Glycosuria.   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   No orders of the defined types were placed in this encounter.    Orders Placed This Encounter  Procedures   US RENAL    Standing Status:   Future    Standing Expiration Date:   03/13/2024    Order Specific Question:   Reason for Exam (SYMPTOM  OR DIAGNOSIS REQUIRED)    Answer:   renal stone    Order  Specific Question:   Preferred imaging location?    Answer:   [facility]   Urinalysis, Routine w reflex microscopic      Return in about 1 year (around 03/13/2024) for with renal US. .   CC: [facility]      Bjorn Pippin 03/15/2023 Patient ID: Franne Grip, male   DOB: 1966-04-08, 57 y.o.   MRN: [id number]

## 2023-09-20 ENCOUNTER — Other Ambulatory Visit: Payer: Self-pay

## 2023-09-20 ENCOUNTER — Telehealth: Payer: Self-pay | Admitting: Urology

## 2023-09-20 NOTE — Telephone Encounter (Signed)
Please advise. Okay to send in Rx?

## 2023-09-20 NOTE — Telephone Encounter (Signed)
Patient changed pharmacy potassium citrate needs to be called into [facility] [place]

## 2023-09-23 MED ORDER — POTASSIUM CITRATE ER 10 MEQ (1080 MG) PO TBCR: 10.0000 meq | EXTENDED_RELEASE_TABLET | Freq: Three times a day (TID) | ORAL | 3 refills | Status: DC

## 2023-09-23 NOTE — Telephone Encounter (Signed)
Rx sent per last providers note.

## 2023-12-05 IMAGING — US US RENAL
1 series · 14 of 25 positions shown · non-contrast
Comparison: Renal ultrasound January 03, 2021

CLINICAL DATA: Uric acid stones.

EXAM:
RENAL / URINARY TRACT ULTRASOUND COMPLETE

[Series 1: us renal · 14 of 59 slices shown]
[im 1/59]
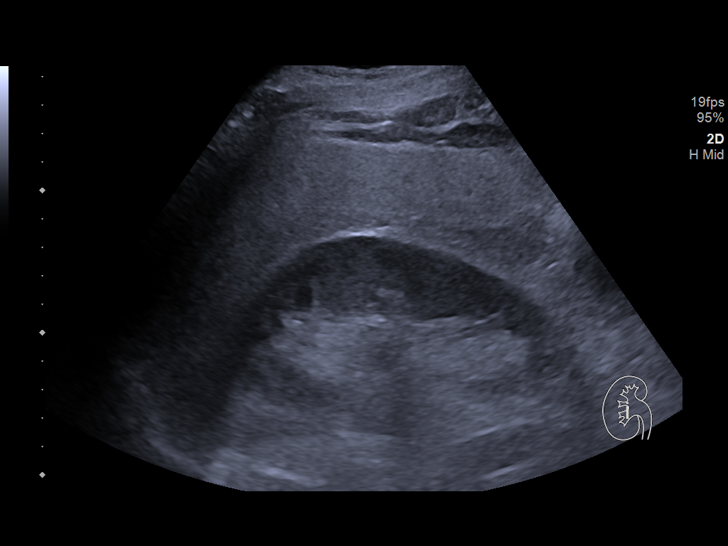
[im 5/59]
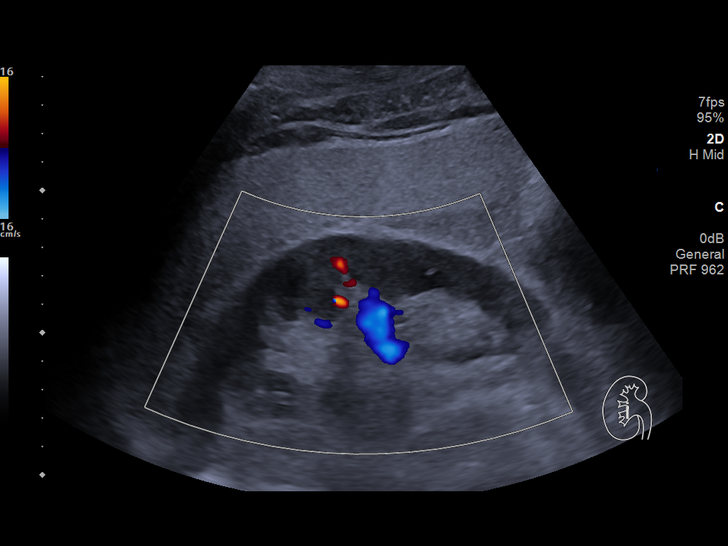
[im 10/59]
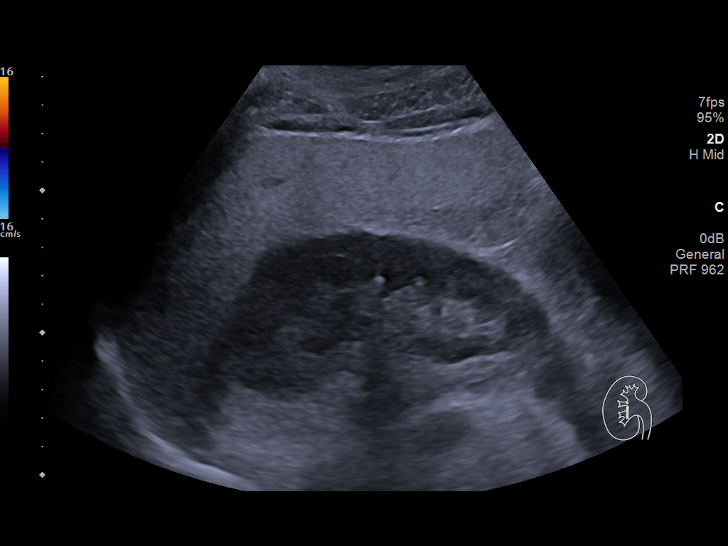
[im 15/59]
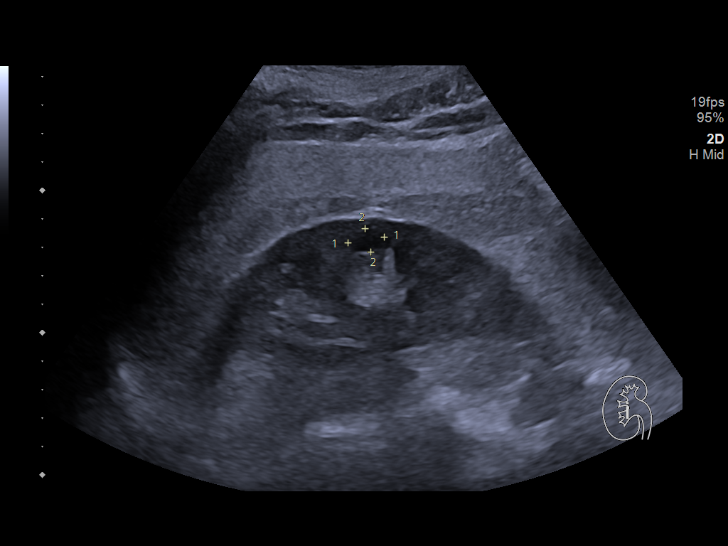
[im 20/59]
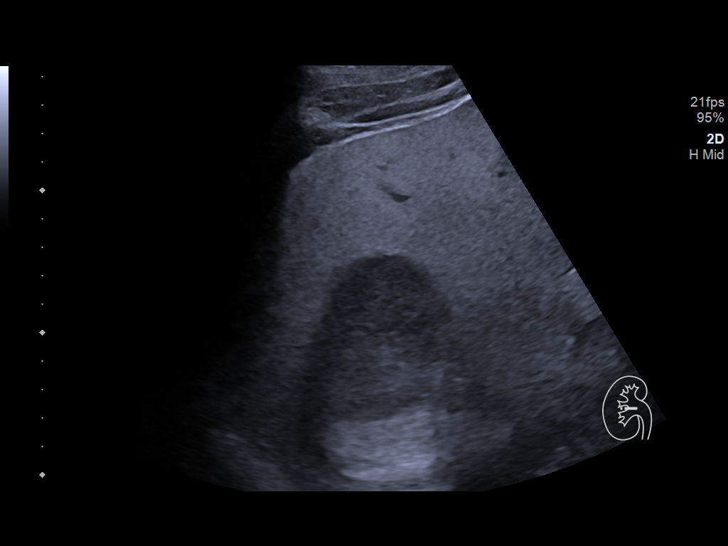
[im 22/59]
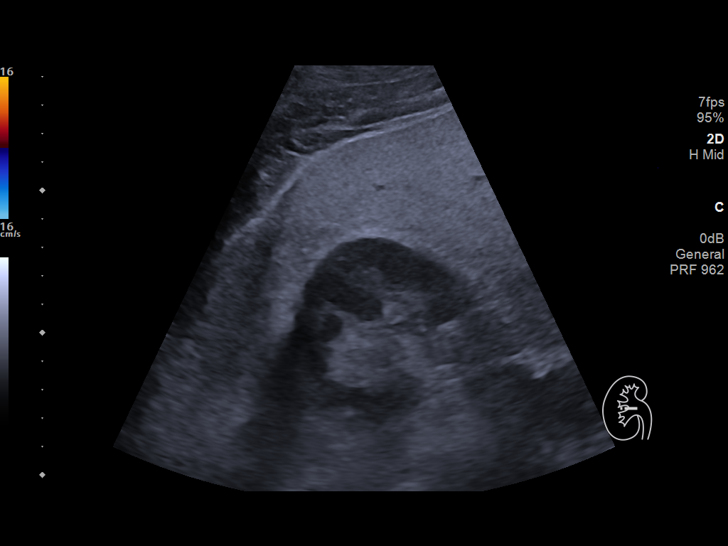
[im 27/59]
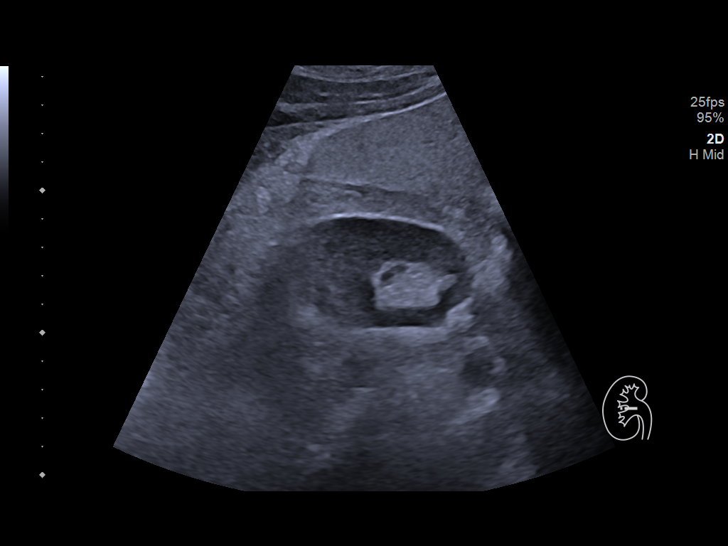
[im 32/59]
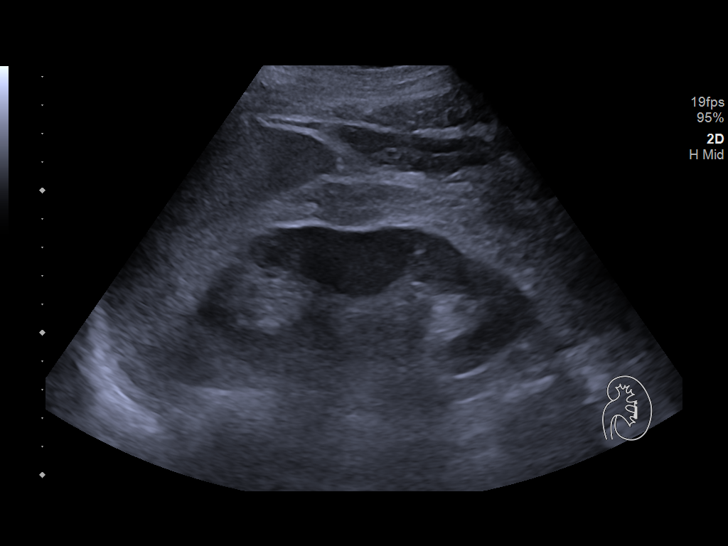
[im 37/59]
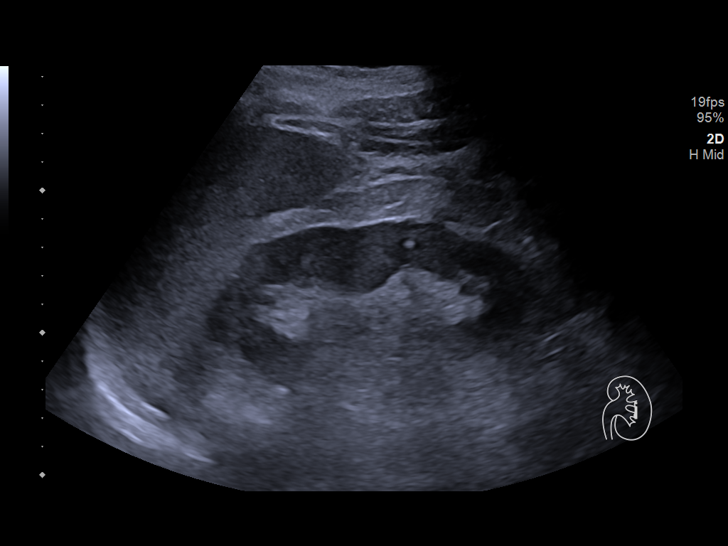
[im 39/59]
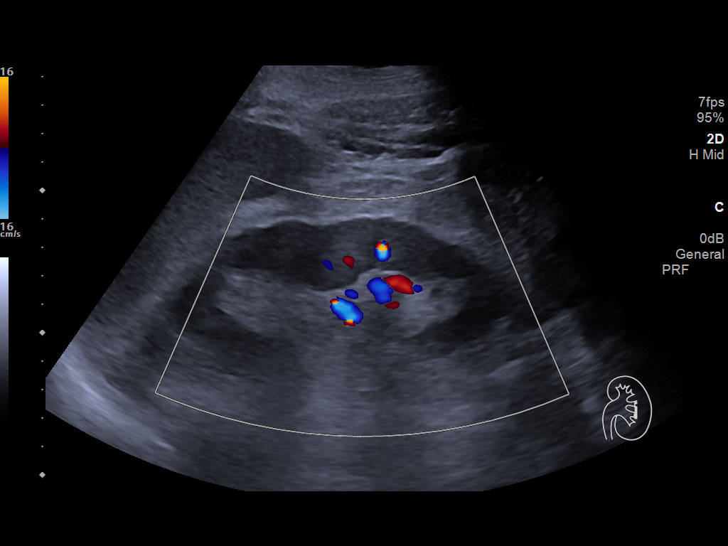
[im 44/59]
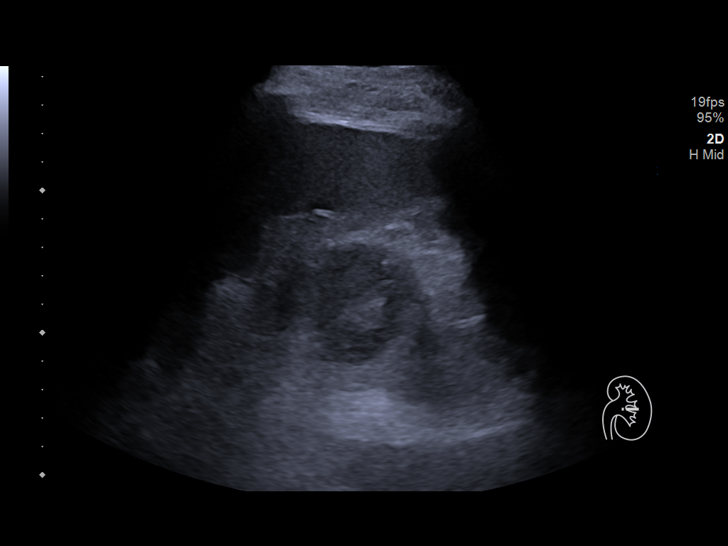
[im 49/59]
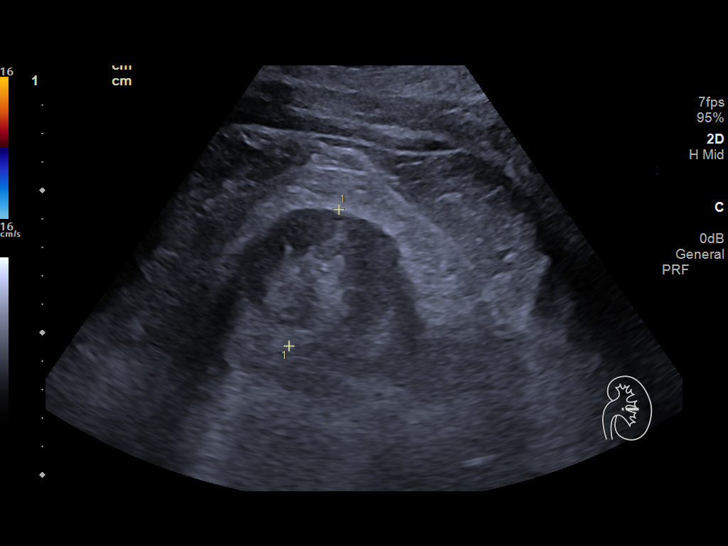
[im 54/59]
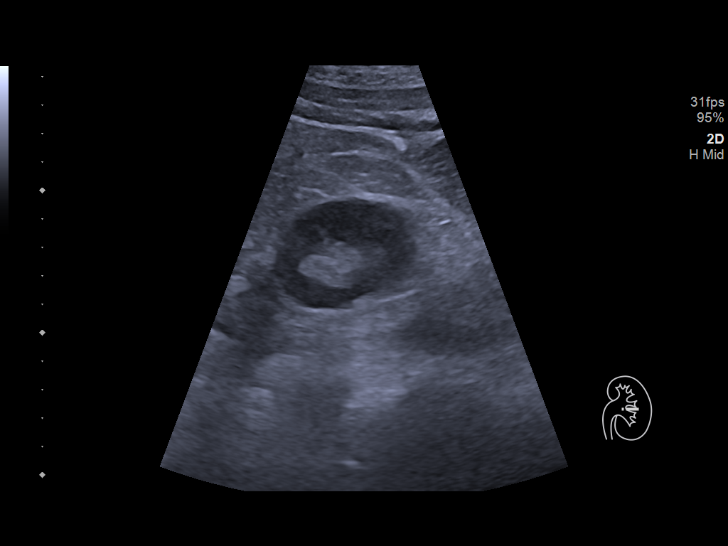
[im 59/59]
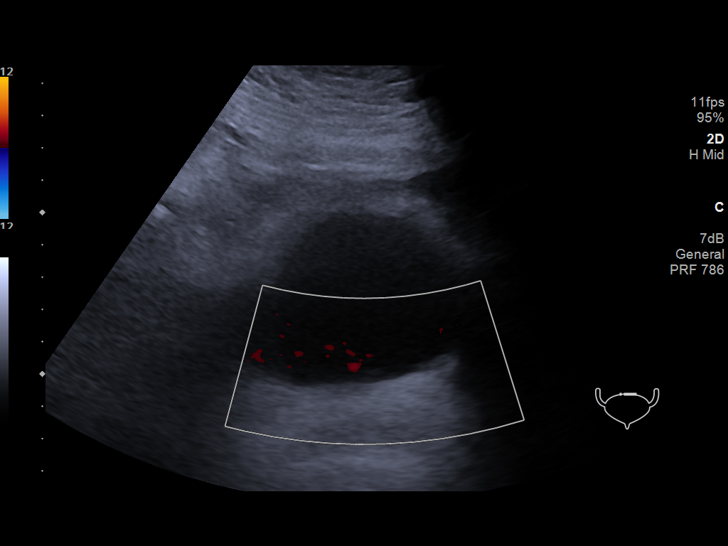

[14 of 25 positions shown; findings below may reference images not displayed]

FINDINGS: Right Kidney:

Renal measurements: 11.6 x 6.2 x 5.3 cm = volume: 201.7 mL.
Echogenicity within normal limits. There is 3 mm nonobstructing
stone in the upper pole right kidney. There is a 1.3 x 0.9 x 1.1 cm
simple cyst in the midpole right kidney. No follow-up is necessary.

Left Kidney:

Renal measurements: 12.1 x 6.4 x 5.1 cm = volume: 205.7 mL.
Echogenicity within normal limits. There is a 5 mm nonobstructing
stone in the lower pole left kidney.

Bladder:

Appears normal for degree of bladder distention.

Other:

None.
IMPRESSION: Small nonobstructing stones in bilateral kidneys. No hydronephrosis
bilaterally.

## 2024-03-12 ENCOUNTER — Ambulatory Visit: Payer: 59 | Admitting: Urology

## 2024-03-12 VITALS — BP 137/78 | HR 64

## 2024-03-12 DIAGNOSIS — N401 Enlarged prostate with lower urinary tract symptoms: Secondary | ICD-10-CM

## 2024-03-12 DIAGNOSIS — R82992 Hyperoxaluria: Secondary | ICD-10-CM

## 2024-03-12 DIAGNOSIS — Z87448 Personal history of other diseases of urinary system: Secondary | ICD-10-CM

## 2024-03-12 DIAGNOSIS — N138 Other obstructive and reflux uropathy: Secondary | ICD-10-CM

## 2024-03-12 DIAGNOSIS — N200 Calculus of kidney: Secondary | ICD-10-CM

## 2024-03-12 LAB — URINALYSIS, ROUTINE W REFLEX MICROSCOPIC
Bilirubin, UA: NEGATIVE
Ketones, UA: NEGATIVE
Leukocytes,UA: NEGATIVE
Nitrite, UA: NEGATIVE
Protein,UA: NEGATIVE
RBC, UA: NEGATIVE
Specific Gravity, UA: 1.02 (ref 1.005–1.030)
Urobilinogen, Ur: 0.2 mg/dL (ref 0.2–1.0)
pH, UA: 6.5 (ref 5.0–7.5)

## 2024-03-12 NOTE — Progress Notes (Unsigned)
 Subjective:  1. Bilateral renal stones   2. Hyperoxaluria    03/12/24: Nathan Carroll returns today in f/u for his history of uric acid stones.  He had a 7mm right renal stone on his last US  in 4/24 but didn't get one prior to this visit. He has no hematuria or pain.  He is currently on litholyte for the stones.   He continues to void well with an IPSS of 1 and nocturia x 1.  His UA is clear.  He had a TURP and cystolithalopaxy in 7/20.    03/14/23: Nathan Carroll returns today in f/u for his history of uric acid stones.  A renal US  prior to this visit shows a possible 7mm non-obstructing right renal stone. He has had no flank pain or hematuria since his last visit.  He continues to void well with and IPSS of 1 and his UA is clear.  He is off of the potassium citrate  but is on potassium chloride .  He was taken off of the lisinopril  because of a high potassium.     02/08/22: Nathan Carroll returns today in f/u for his history of uric acid stones.  He has had no flank pain or hematuria.   A renal US  prior to this visit showed a 3mm right renal stone and a 5mm left renal stone.  He also has a right renal cyst.  He has been on potassium citrate  and is back up to 2 tabs.  .  His last K was 4.3 on 12/22/20 and 4.2 on 01/15/22.  His PSA was 2.21.  He continues to void well after the TURP in 2020.   He has retained erectile function but reduced ejaculation.   02/09/21: Nathan Carroll returns today for annual f/u.   A renal US  prior to this visit shows no renal or bladder stones. His IPSS is 0.  He had been on potassium citrate  but he ran out of the med last week.   His K was 4.3 on 12/22/20.  His medical doc was concerned about the combination with lisinopril  and suggested he cut the potassium citrate  to 1 daily.   UA has 3+ glucose  01/01/20: S/p TURP and cystolithalopaxy in 7/20. Stream is strong. He has no further hematuria. He has no incontinence. His IPSS is 1.   He has not been sexually active but he can get an erection and has variable ejaculatory  function with occasional delayed drainage.   His stone was Uric acid and his metabolic w/u showed normal labs but he had a low volume, slightly high oxalate, low pH and high Uric acid supersaturation.         ROS:  ROS:  A complete review of systems was performed.  All systems are negative except for pertinent findings as noted.   Review of Systems  All other systems reviewed and are negative.   No Known Allergies  Outpatient Encounter Medications as of 03/12/2024  Medication Sig   amLODipine (NORVASC) 2.5 MG tablet Take 2.5 mg by mouth daily.   Dapagliflozin-metFORMIN  HCl ER (XIGDUO XR) 02-999 MG TB24 1 tablet   potassium chloride  (KLOR-CON ) 10 MEQ tablet Take 10 mEq by mouth 3 (three) times daily.   rosuvastatin  (CRESTOR ) 20 MG tablet Take 20 mg by mouth at bedtime.   [DISCONTINUED] potassium citrate  (UROCIT-K ) 10 MEQ (1080 MG) SR tablet 1 tablet with meals Orally Twice a day   [DISCONTINUED] potassium citrate  (UROCIT-K ) 10 MEQ (1080 MG) SR tablet Take 1 tablet (10 mEq total) by mouth 3 (  three) times daily with meals.   No facility-administered encounter medications on file as of 03/12/2024.    Past Medical History:  Diagnosis Date   Bladder stone    Hypercholesteremia    Hypertension    Prediabetes    Sinus bradycardia     Past Surgical History:  Procedure Laterality Date   COLONOSCOPY     x2   CYSTOSCOPY WITH LITHOLAPAXY N/A 04/16/2019   Procedure: CYSTOSCOPY WITH LITHOLAPAXY;  Surgeon: Nathan Luster, MD;  Location: [facility];  Service: Urology;  Laterality: N/A;   TRANSURETHRAL RESECTION OF PROSTATE N/A 04/16/2019   Procedure: TRANSURETHRAL INCIOSION OF THE PROSTATE (TURP);  Surgeon: Nathan Luster, MD;  Location: [facility];  Service: Urology;  Laterality: N/A;    Social History   Socioeconomic History   Marital status: Married    Spouse name: Not on file   Number of children: Not on file   Years of education: Not on file   Highest education level: Not on file   Occupational History   Not on file  Tobacco Use   Smoking status: Never   Smokeless tobacco: Never  Substance and Sexual Activity   Alcohol use: Not Currently   Drug use: Not on file   Sexual activity: Not on file  Other Topics Concern   Not on file  Social History Narrative   Not on file   Social Drivers of Health   Financial Resource Strain: Not on file  Food Insecurity: Not on file  Transportation Needs: Not on file  Physical Activity: Not on file  Stress: Not on file  Social Connections: Not on file  Intimate Partner Violence: Not on file    No family history on file.     Objective: Vitals:   03/12/24 1453  BP: 137/78  Pulse: 64     Physical Exam Vitals reviewed.  Constitutional:      Appearance: Normal appearance.  Neurological:     Mental Status: He is alert.     Lab Results:  Results for orders placed or performed in visit on 03/12/24 (from the past 24 hours)  Urinalysis, Routine w reflex microscopic     Status: Abnormal   Collection Time: 03/12/24  2:55 PM  Result Value Ref Range   Specific Gravity, UA 1.020 1.005 - 1.030   pH, UA 6.5 5.0 - 7.5   Color, UA Yellow Yellow   Appearance Ur Clear Clear   Leukocytes,UA Negative Negative   Protein,UA Negative Negative/Trace   Glucose, UA 3+ (A) Negative   Ketones, UA Negative Negative   RBC, UA Negative Negative   Bilirubin, UA Negative Negative   Urobilinogen, Ur 0.2 0.2 - 1.0 mg/dL   Nitrite, UA Negative Negative   Microscopic Examination Comment    Narrative   Performed at:  [address] - [facility] [address], [place], [place]  [id number] Lab Director: Liliana Regulus MT, Phone:  [phone]      BMET No results for input(s): "NA", "K", "CL", "CO2", "GLUCOSE", "BUN", "CREATININE", "CALCIUM " in the last 72 hours. PSA PSA  Date Value Ref Range Status  12/25/2019 1.6  Final   No results found for: "TESTOSTERONE" Recent Results (from the past 2160 hours)  Urinalysis, Routine w  reflex microscopic     Status: Abnormal   Collection Time: 03/12/24  2:55 PM  Result Value Ref Range   Specific Gravity, UA 1.020 1.005 - 1.030   pH, UA 6.5 5.0 - 7.5   Color, UA Yellow Yellow  Appearance Ur Clear Clear   Leukocytes,UA Negative Negative   Protein,UA Negative Negative/Trace   Glucose, UA 3+ (A) Negative   Ketones, UA Negative Negative   RBC, UA Negative Negative   Bilirubin, UA Negative Negative   Urobilinogen, Ur 0.2 0.2 - 1.0 mg/dL   Nitrite, UA Negative Negative   Microscopic Examination Comment     Comment: Microscopic not indicated and not performed.      UA has 3+ glucose but is o/w unremarkable.   Studies/Results: Renal US  reviewed.  No results found.  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  History of Uric acid urolithiasis.   He is on the litholyte and is doing well.   Renal US  in a year.   Glycosuria.   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   No orders of the defined types were placed in this encounter.    Orders Placed This Encounter  Procedures   US  RENAL    Standing Status:   Future    Expected Date:   03/08/2025    Expiration Date:   03/12/2025    Reason for Exam (SYMPTOM  OR DIAGNOSIS REQUIRED):   renal stones    Preferred imaging location?:   [facility]   Urinalysis, Routine w reflex microscopic      Return in about 1 year (around 03/12/2025) for available MD with renal US . .   CC: [facility]      Nathan Carroll 03/13/2024 Patient ID: Ryland Cozier, male   DOB: 1966-03-03, 58 y.o.   MRN: [id number]

## 2024-03-13 ENCOUNTER — Encounter: Payer: Self-pay | Admitting: Urology

## 2025-03-05 ENCOUNTER — Other Ambulatory Visit (HOSPITAL_COMMUNITY)

## 2025-03-12 ENCOUNTER — Ambulatory Visit: Admitting: Urology
